# Patient Record
Sex: Female | Born: 1995 | Race: White | Hispanic: No | Marital: Single | Smoking: Former smoker
Health system: Southern US, Community
[De-identification: ages and names within clinical notes are randomized; demographics above are authoritative.]

## PROBLEM LIST (undated history)

## (undated) DIAGNOSIS — F32A Depression, unspecified: Secondary | ICD-10-CM

## (undated) DIAGNOSIS — F329 Major depressive disorder, single episode, unspecified: Secondary | ICD-10-CM

## (undated) DIAGNOSIS — R45851 Suicidal ideations: Secondary | ICD-10-CM

## (undated) DIAGNOSIS — F4323 Adjustment disorder with mixed anxiety and depressed mood: Secondary | ICD-10-CM

## (undated) DIAGNOSIS — Z789 Other specified health status: Secondary | ICD-10-CM

## (undated) DIAGNOSIS — F419 Anxiety disorder, unspecified: Secondary | ICD-10-CM

## (undated) DIAGNOSIS — F322 Major depressive disorder, single episode, severe without psychotic features: Secondary | ICD-10-CM

## (undated) DIAGNOSIS — F332 Major depressive disorder, recurrent severe without psychotic features: Secondary | ICD-10-CM

## (undated) DIAGNOSIS — R87629 Unspecified abnormal cytological findings in specimens from vagina: Secondary | ICD-10-CM

## (undated) DIAGNOSIS — F319 Bipolar disorder, unspecified: Secondary | ICD-10-CM

## (undated) DIAGNOSIS — F3131 Bipolar disorder, current episode depressed, mild: Secondary | ICD-10-CM

## (undated) DIAGNOSIS — F988 Other specified behavioral and emotional disorders with onset usually occurring in childhood and adolescence: Secondary | ICD-10-CM

## (undated) HISTORY — PX: TONSILLECTOMY: SUR1361

## (undated) HISTORY — DX: Anxiety disorder, unspecified: F41.9

## (undated) HISTORY — DX: Unspecified abnormal cytological findings in specimens from vagina: R87.629

## (undated) HISTORY — DX: Bipolar disorder, unspecified: F31.9

## (undated) HISTORY — PX: WISDOM TOOTH EXTRACTION: SHX21

---
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
## 2013-06-16 ENCOUNTER — Ambulatory Visit (HOSPITAL_COMMUNITY)
Admission: RE | Admit: 2013-06-16 | Discharge: 2013-06-16 | Disposition: A | Discharge status: Home / Self Care | Payer: 59 | Attending: Psychiatry | Admitting: Psychiatry

## 2013-06-16 ENCOUNTER — Ambulatory Visit (HOSPITAL_COMMUNITY): Admission: RE | Admit: 2013-06-16 | Payer: 59 | Source: Home / Self Care | Admitting: Psychiatry

## 2013-06-16 HISTORY — DX: Depression, unspecified: F32.A

## 2013-06-16 HISTORY — DX: Major depressive disorder, single episode, unspecified: F32.9

## 2013-06-17 ENCOUNTER — Encounter (HOSPITAL_COMMUNITY): Payer: Self-pay | Admitting: *Deleted

## 2013-06-17 NOTE — BH Assessment (Signed)
Tele Assessment Note   Debbie King is a 18 y.o. female who presents as a walk in, accompanied by a friend.  Pt denies SI/HI/AVH.  Pt requested referrals for therapy to help with depression issues and communicating with her parents.  This Clinical research associatewriter provided referrals    Axis I: Mood Disorder NOS Axis II: Deferred Axis III:  Past Medical History  Diagnosis Date  . Depression    Axis IV: other psychosocial or environmental problems, problems related to social environment and problems with primary support group Axis V: 61-70 mild symptoms  Past Medical History:  Past Medical History  Diagnosis Date  . Depression     No past surgical history on file.  Family History: No family history on file.  Social History:  reports that she does not drink alcohol or use illicit drugs. Her tobacco history is not on file.  Additional Social History:  Alcohol / Drug Use Pain Medications: None  Prescriptions: None  Over the Counter: None  History of alcohol / drug use?: No history of alcohol / drug abuse Longest period of sobriety (when/how long): None   CIWA:   COWS:    Allergies: Allergies not on file  Home Medications:  (Not in a hospital admission)  OB/GYN Status:  No LMP recorded.  General Assessment Data Location of Assessment: [facility] Services Is this a Tele or Face-to-Face Assessment?: Face-to-Face Is this an Initial Assessment or a Re-assessment for this encounter?: Initial Assessment Living Arrangements: Parent (Lives with parents ) Can pt return to current living arrangement?: Yes Admission Status: Voluntary Is patient capable of signing voluntary admission?: Yes Transfer from: Acute Hospital Referral Source: MD  Medical Screening Exam [facility] Walk-in ONLY) Medical Exam completed: No Reason for MSE not completed: Patient Refused  [facility] Living Arrangements: Parent (Lives with parents ) Name of Psychiatrist: None  Name of Therapist: None    Education Status Is patient currently in school?: No Current Grade: None  Highest grade of school patient has completed: None  Name of school: None  Contact person: None   Risk to self Suicidal Ideation: No Suicidal Intent: No Is patient at risk for suicide?: No Suicidal Plan?: No Access to Means: No What has been your use of drugs/alcohol within the last 12 months?: None  Previous Attempts/Gestures: No How many times?: 0 Other Self Harm Risks: None  Triggers for Past Attempts: None known Intentional Self Injurious Behavior: None Family Suicide History: No Recent stressful life event(s): Conflict (Comment) (Issues with parents ) Persecutory voices/beliefs?: No Depression: No Depression Symptoms:  (None reported ) Substance abuse history and/or treatment for substance abuse?: No Suicide prevention information given to non-admitted patients: Not applicable  Risk to Others Homicidal Ideation: No Thoughts of Harm to Others: No Current Homicidal Intent: No Current Homicidal Plan: No Access to Homicidal Means: No Identified Victim: None  History of harm to others?: No Assessment of Violence: None Noted Violent Behavior Description: None  Does patient have access to weapons?: No Criminal Charges Pending?: No Does patient have a court date: No  Psychosis Hallucinations: None noted Delusions: None noted  Mental Status Report Appear/Hygiene: Unremarkable Eye Contact: Good Motor Activity: Unremarkable Speech: Logical/coherent Level of Consciousness: Alert Mood: Other (Comment) (Appropriate ) Affect: Appropriate to circumstance Anxiety Level: None Thought Processes: Coherent;Relevant Judgement: Unimpaired Orientation: Person;Place;Time;Situation Obsessive Compulsive Thoughts/Behaviors: None  Cognitive Functioning Concentration: Normal Memory: Recent Intact;Remote Intact IQ: Average Insight: Good Impulse Control: Good Appetite: Good Weight Loss: 0 Weight  Gain:  0 Sleep: No Change Total Hours of Sleep: 6 Vegetative Symptoms: None  ADLScreening [facility] Assessment Services) Patient's cognitive ability adequate to safely complete daily activities?: Yes Patient able to express need for assistance with ADLs?: Yes Independently performs ADLs?: Yes (appropriate for developmental age)  Prior Inpatient Therapy Prior Inpatient Therapy: No Prior Therapy Dates: None  Prior Therapy Facilty/Provider(s): None  Reason for Treatment: None   Prior Outpatient Therapy Prior Outpatient Therapy: No Prior Therapy Dates: None  Prior Therapy Facilty/Provider(s): None  Reason for Treatment: None   ADL Screening (condition at time of admission) Patient's cognitive ability adequate to safely complete daily activities?: Yes Is the patient deaf or have difficulty hearing?: No Does the patient have difficulty seeing, even when wearing glasses/contacts?: No Does the patient have difficulty concentrating, remembering, or making decisions?: Yes Patient able to express need for assistance with ADLs?: Yes Does the patient have difficulty dressing or bathing?: No Independently performs ADLs?: Yes (appropriate for developmental age) Does the patient have difficulty walking or climbing stairs?: No Weakness of Legs: None Weakness of Arms/Hands: None  Home Assistive Devices/Equipment Home Assistive Devices/Equipment: None  Therapy Consults (therapy consults require a physician order) PT Evaluation Needed: No OT Evalulation Needed: No SLP Evaluation Needed: No Abuse/Neglect Assessment (Assessment to be complete while patient is alone) Physical Abuse: Denies Verbal Abuse: Denies Sexual Abuse: Denies Exploitation of patient/patient's resources: Denies Self-Neglect: Denies Values / Beliefs Cultural Requests During Hospitalization: None Spiritual Requests During Hospitalization: None Consults Spiritual Care Consult Needed: No Social Work Consult Needed: No Dispensing opticianAdvance  Directives (For Healthcare) Advance Directive: Patient does not have advance directive;Patient would not like information Pre-existing out of facility DNR order (yellow form or pink MOST form): No Nutrition Screen- [facility] Adult/WL/AP Patient's home diet: Regular  Additional Information 1:1 In Past 12 Months?: No CIRT Risk: No Elopement Risk: No Does patient have medical clearance?: No     Disposition:  Disposition Initial Assessment Completed for this Encounter: Yes Disposition of Patient: Other dispositions;Outpatient treatment (Referrals provided ) Type of outpatient treatment: Adult Other disposition(s): Information only  Murrell Reddeneresa C Scotty Pinder 06/17/2013 6:35 AM

## 2013-10-08 ENCOUNTER — Ambulatory Visit (HOSPITAL_COMMUNITY)
Admission: RE | Admit: 2013-10-08 | Discharge: 2013-10-08 | Disposition: A | Discharge status: Home / Self Care | Payer: PPO | Source: Home / Self Care | Attending: Psychiatry | Admitting: Psychiatry

## 2013-10-08 ENCOUNTER — Encounter (HOSPITAL_COMMUNITY): Payer: Self-pay | Admitting: *Deleted

## 2013-10-08 ENCOUNTER — Observation Stay (HOSPITAL_COMMUNITY)
Admission: AD | Admit: 2013-10-08 | Discharge: 2013-10-09 | Disposition: A | Discharge status: Home / Self Care | Payer: PPO | Attending: Psychiatry | Admitting: Psychiatry

## 2013-10-08 DIAGNOSIS — F329 Major depressive disorder, single episode, unspecified: Secondary | ICD-10-CM | POA: Diagnosis present

## 2013-10-08 DIAGNOSIS — F32A Depression, unspecified: Secondary | ICD-10-CM

## 2013-10-08 DIAGNOSIS — F4323 Adjustment disorder with mixed anxiety and depressed mood: Secondary | ICD-10-CM | POA: Diagnosis not present

## 2013-10-08 DIAGNOSIS — R45851 Suicidal ideations: Secondary | ICD-10-CM

## 2013-10-08 HISTORY — DX: Depression, unspecified: F32.A

## 2013-10-08 HISTORY — DX: Other specified health status: Z78.9

## 2013-10-08 HISTORY — DX: Suicidal ideations: R45.851

## 2013-10-08 MED ORDER — ALUM & MAG HYDROXIDE-SIMETH 200-200-20 MG/5ML PO SUSP: 30.0000 mL | ORAL | Status: DC | PRN

## 2013-10-08 MED ORDER — ACETAMINOPHEN 325 MG PO TABS: 650.0000 mg | ORAL_TABLET | Freq: Four times a day (QID) | ORAL | Status: DC | PRN

## 2013-10-08 MED ORDER — TRAZODONE HCL 50 MG PO TABS
50.0000 mg | ORAL_TABLET | Freq: Every evening | ORAL | Status: DC | PRN
  Filled 2013-10-08: qty 1

## 2013-10-08 MED ORDER — MAGNESIUM HYDROXIDE 400 MG/5ML PO SUSP: 30.0000 mL | Freq: Every day | ORAL | Status: DC | PRN

## 2013-10-08 NOTE — BH Assessment (Signed)
Assessment Note  Debbie King is an 18 y.o. female.  PT reported walking from [facility] to [facility].  She reported random crying episodes yesterday and feeling sad and depressed.  PT reported experiencing "negative thoughts" of jumping in front of a car or cutting her wrist.  PT reported current SI w/plan of jumping in front of a car.  She reported hearing "rumbling sounds ."  PT reported being unsure if she could contract for safety.  PT denied HI, AH, and VH.  PT reported being prescribed Lexapro and Citalopram and not taking the meds for the past 3 1/2 wks.  She reported engaging in weekly counseling, however she has not attended a session since 09-21-2013.  PT reported this is her first year in college and she is having some issues w/other students.  PT also reported conflict w/her Parents and reported no current support system in [place].  PT denied any drug use including ETOH.  PT reported experiencing depressive sx in April 2015 when she went off birth control.  PT reported and upcoming neurological appointment on 10-22-2013 w/[facility] for headaches.    Axis I: Anxiety Disorder NOS and Major Depression, single episode Axis II: Deferred Axis IV: educational problems, other psychosocial or environmental problems, problems related to social environment and problems with primary support group Axis V: 11-20 some danger of hurting self or others possible OR occasionally fails to maintain minimal personal hygiene OR gross impairment in communication  Past Medical History: No past medical history on file.  No past surgical history on file.  Family History: No family history on file.  Social History:  has no tobacco, alcohol, and drug history on file.  Additional Social History:     CIWA:   COWS:    Allergies: Allergies not on file  Home Medications:  (Not in a hospital admission)  OB/GYN Status:  No LMP recorded.  General Assessment Data Location of Assessment: [facility] Assessment  Services ACT Assessment: Yes Is this a Tele or Face-to-Face Assessment?: Face-to-Face Is this an Initial Assessment or a Re-assessment for this encounter?: Initial Assessment Living Arrangements: Alone;Other (Comment) (Live on [employer]) Can pt return to current living arrangement?: Yes Admission Status: Voluntary Is patient capable of signing voluntary admission?: Yes Transfer from: Home Referral Source: Self/Family/Friend  Medical Screening Exam [facility] Walk-in ONLY) Medical Exam completed: No Reason for MSE not completed: Other: (PT walk-in at [facility])  [facility] Crisis Care Plan Living Arrangements: Alone;Other (Comment) (Live on [employer]) Name of Psychiatrist: Dr. Katrinka Blazing ([facility] In [place]) Name of Therapist: Ms. Dayton Scrape ([facility] in [place])  Education Status Is patient currently in school?: Yes (first year of college) Current Grade: Freshman Highest grade of school patient has completed: High school graduate Name of school: [employer] Contact person: None  Risk to self with the past 6 months Suicidal Ideation: Yes-Currently Present Suicidal Intent: No (PT denied having intent) Is patient at risk for suicide?: Yes Suicidal Plan?: Yes-Currently Present Specify Current Suicidal Plan: Walking in front of car or cutting wrist Access to Means: No What has been your use of drugs/alcohol within the last 12 months?: PT denied any drug use Previous Attempts/Gestures: No (PT reported SI w/no plans or intent) How many times?: 0 Other Self Harm Risks: None Triggers for Past Attempts: None known Intentional Self Injurious Behavior: None Family Suicide History: Unknown Recent stressful life event(s): Other (Comment) (first yr of college, weight gain, stop birth control) Persecutory voices/beliefs?: No (PT reported having  negative thoughts and hear rumbling noise) Depression: Yes Depression Symptoms:  Tearfulness;Isolating;Feeling worthless/self pity Substance abuse history and/or treatment for substance abuse?: No (PT denied use) Suicide prevention information given to non-admitted patients: Not applicable  Risk to Others within the past 6 months Homicidal Ideation: No Thoughts of Harm to Others: No Current Homicidal Intent: No Current Homicidal Plan: No Access to Homicidal Means: No Identified Victim: N/A History of harm to others?: No Assessment of Violence: None Noted Violent Behavior Description: N/A Does patient have access to weapons?: No Criminal Charges Pending?: No Does patient have a court date: No  Psychosis Hallucinations: None noted (PT denied AH and VH) Delusions: None noted  Mental Status Report Appear/Hygiene: Unremarkable Eye Contact: Fair Motor Activity: Unremarkable Speech: Logical/coherent Level of Consciousness: Alert Mood: Anxious;Depressed Affect: Anxious;Depressed;Flat Anxiety Level: Moderate Thought Processes: Coherent Judgement: Impaired Orientation: Person;Place;Time;Situation Obsessive Compulsive Thoughts/Behaviors: None  Cognitive Functioning Concentration: Decreased Memory: Recent Intact;Remote Intact IQ: Average Insight: Poor Impulse Control: Poor Appetite: Good Weight Loss: 0 Weight Gain: 20 (since April 2015) Sleep: No Change Total Hours of Sleep: 8 Vegetative Symptoms: None  ADLScreening [facility] Assessment Services) Patient's cognitive ability adequate to safely complete daily activities?: Yes Patient able to express need for assistance with ADLs?: No Independently performs ADLs?: Yes (appropriate for developmental age)  Prior Inpatient Therapy Prior Inpatient Therapy: No Prior Therapy Dates: N/A Prior Therapy Facilty/Provider(s): N/A Reason for Treatment: N/A  Prior Outpatient Therapy Prior Outpatient Therapy: Yes Prior Therapy Dates: September 21, 2013 Prior Therapy Facilty/Provider(s): [facility]  ([place], [place]) Reason for Treatment: Anxiety  ADL Screening (condition at time of admission) Patient's cognitive ability adequate to safely complete daily activities?: Yes Is the patient deaf or have difficulty hearing?: No Does the patient have difficulty seeing, even when wearing glasses/contacts?: No Does the patient have difficulty concentrating, remembering, or making decisions?: No Patient able to express need for assistance with ADLs?: No Does the patient have difficulty dressing or bathing?: No Independently performs ADLs?: Yes (appropriate for developmental age) Does the patient have difficulty walking or climbing stairs?: No Weakness of Legs: None Weakness of Arms/Hands: None  Home Assistive Devices/Equipment Home Assistive Devices/Equipment: None    Abuse/Neglect Assessment (Assessment to be complete while patient is alone) Physical Abuse: Denies Verbal Abuse: Denies Sexual Abuse: Denies Exploitation of patient/patient's resources: Denies Self-Neglect: Denies Values / Beliefs Cultural Requests During Hospitalization: None Spiritual Requests During Hospitalization: None        Additional Information 1:1 In Past 12 Months?: No CIRT Risk: No Elopement Risk: No Does patient have medical clearance?: No (PT walk-in at [facility])     Disposition:  Disposition Initial Assessment Completed for this Encounter: Yes Disposition of Patient: Inpatient treatment program (OBS) Type of inpatient treatment program: Adult (OBS)  On Site Evaluation by:   Reviewed with Physician:    Dey-Johnson,Mesa Janus 10/08/2013 7:06 PM

## 2013-10-08 NOTE — BH Assessment (Signed)
Consulted with Renata Caprice, NP after completing assessment on PT.  Per Renata Caprice; PT be placed in  OBS.  Consulted with Tanna Savoy and Per Minerva Areola PT be placed in OBS 6.  Per Clearence Cheek, Tech will follow up with PT on disposition.

## 2013-10-09 DIAGNOSIS — F329 Major depressive disorder, single episode, unspecified: Secondary | ICD-10-CM | POA: Diagnosis not present

## 2013-10-09 MED ORDER — CITALOPRAM HYDROBROMIDE 10 MG PO TABS
10.0000 mg | ORAL_TABLET | Freq: Every day | ORAL | Status: DC
  Filled 2013-10-09 (×4): qty 1

## 2013-10-09 MED ORDER — HYDROXYZINE HCL 25 MG PO TABS: 25.0000 mg | ORAL_TABLET | Freq: Four times a day (QID) | ORAL | Status: DC | PRN

## 2013-10-09 MED ORDER — CITALOPRAM HYDROBROMIDE 10 MG PO TABS: 10.0000 mg | ORAL_TABLET | Freq: Every day | ORAL | Status: DC

## 2013-10-09 MED ORDER — HYDROXYZINE HCL 25 MG PO TABS
25.0000 mg | ORAL_TABLET | Freq: Four times a day (QID) | ORAL | Status: DC | PRN
  Filled 2013-10-09: qty 1

## 2013-10-09 NOTE — Progress Notes (Signed)
Pt denies SI/HI/AVH. Pt given discharge instructions including prescriptions and f/u appointments. Pt states understanding. Pt states receipt of all belongings.   

## 2013-10-09 NOTE — Plan of Care (Signed)
[facility] Observation Crisis Plan  Reason for Crisis Plan:  Crisis Stabilization   Plan of Care:  Referral for Telepsychiatry/Psychiatric Consult  Family Support:    mother Debbie King  Current Living Environment:  Living Arrangements: Alone;Spouse/significant other  Insurance:   Hospital Account   Name Acct ID Class Status Primary Coverage   Debbie King, Debbie King [id number] BEHAVIORAL HEALTH OBSERVATION Open None        Guarantor Account (for Hospital Account [id number])   Name Relation to Pt Service Area Active? Acct Type   Debbie King Self [employer] [facility]   Address Phone       [address] [place], [place] [address] [phone](H)          Coverage Information (for Hospital Account [id number])   Not on file      Legal Guardian:    SELF  Primary Care Provider:  Dr. Katrinka Blazing  Current Outpatient Providers:  NONE  Psychiatrist:   NONE  Counselor/Therapist:   NONE  Compliant with Medications:  No  Additional Information:   Debbie King 9/6/20156:51 AM

## 2013-10-09 NOTE — Discharge Summary (Signed)
[facility] OBS UNIT DISCHARGE SUMMARY & SRA  Subjective: Pt seen and chart reviewed. Pt reports hearing voices that are telling her to harm self. Pt reports that they are somewhat better, but that her thoughts are racing a lot. Pt also reports that she had a break from school when this happened last semester and that she does not want to miss more school. Pt is open to outpatient and will be sent home with resources. This NP contacted pt's father Tziporah Knoke [phone]) and he is available as a strong local support system if pt needs this; pt is aware.   HPI: Debbie King is an 18 y.o. female. PT reported walking from [facility] to [facility].  She reported random crying episodes yesterday and feeling sad and depressed.  PT reported experiencing "negative thoughts" of jumping in front of a car or cutting her wrist.  PT reported current SI w/plan of jumping in front of a car.  She reported hearing "rumbling sounds ."  PT reported being unsure if she could contract for safety.  PT denied HI, AH, and VH.  PT reported being prescribed Lexapro and Citalopram and not taking the meds for the past 3 1/2 wks.  She reported engaging in weekly counseling, however she has not attended a session since 09-21-2013.  PT reported this is her first year in college and she is having some issues w/other students.  PT also reported conflict w/her Parents and reported no current support system in [place].  PT denied any drug use including ETOH.  PT reported experiencing depressive sx in April 2015 when she went off birth control.  PT reported and upcoming neurological appointment on 10-22-2013 w/[facility] for headaches.    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 Current non-smoker but past smoking history unknown    Axis IV: other psychosocial or environmental problems and problems related to social  environment Axis V: 41-50 serious symptoms  Psychiatric Specialty Exam: Physical Exam  Review of Systems  Constitutional: Negative.   HENT: Negative.   Eyes: Negative.   Respiratory: Negative.   Cardiovascular: Negative.   Gastrointestinal: Negative.   Genitourinary: Negative.   Musculoskeletal: Negative.   Skin: Negative.   Neurological: Negative.   Endo/Heme/Allergies: Negative.     Blood pressure 99/59, pulse 77, temperature 97.9 F (36.6 C), temperature source Oral, resp. rate 16, height  (1.626 m), weight 65.772 kg (145 lb), SpO2 100.00%.Body mass index is 24.88 kg/(m^2).  General Appearance: Casual  Eye Contact::  Fair  Speech:  Clear and Coherent  Volume:  Normal  Mood:  Anxious and Depressed  Affect:  Appropriate and Congruent  Thought Process:  Coherent and Goal Directed  Orientation:  Full (Time, Place, and Person)  Thought Content:  WDL  Suicidal Thoughts:  No  Not since yesterday  Homicidal Thoughts:  No  Memory:  Immediate;   Good Recent;   Good Remote;   Good  Judgement:  Good  Insight:  Good  Psychomotor Activity:  Normal  Concentration:  Good  Recall:  Good  Akathisia:  No  Handed:  Right  AIMS (if indicated):     Assets:  Desire for Improvement Resilience  Sleep:       Medications: acetaminophen, alum & mag hydroxide-simeth, magnesium hydroxide, traZODone   History reviewed. No pertinent past surgical history.  Family History: No family history on file.   Social  History:  reports that she has never smoked. She does not have any smokeless tobacco history on file. Her alcohol and drug histories are not on file.       Suicide Risk Assessment     Nursing information obtained from:    Demographic factors:    Current Mental Status:    Loss Factors:    Historical Factors:    Risk Reduction Factors:    Total Time spent with patient: 45 minutes  CLINICAL FACTORS:   Depression:   Hopelessness  Psychiatric Specialty Exam:      Blood pressure 99/59, pulse 77, temperature 97.9 F (36.6 C), temperature source Oral, resp. rate 16, height  (1.626 m), weight 65.772 kg (145 lb), SpO2 100.00%.Body mass index is 24.88 kg/(m^2).  SEE PSE ABOVE  COGNITIVE FEATURES THAT CONTRIBUTE TO RISK:  Closed-mindedness    SUICIDE RISK:   Mild:  Suicidal ideation of limited frequency, intensity, duration, and specificity.  There are no identifiable plans, no associated intent, mild dysphoria and related symptoms, good self-control (both objective and subjective assessment), few other risk factors, and identifiable protective factors, including available and accessible social support.  Plan:   Discharge home with outpatient resources and prescription for Vistaril and Celexa.  -Celexa  daily for depression -Vistaril  q6h PRN for anxiety and racing thoughts   Beau Fanny, FNP-BC 10/09/2013, 6:59 PM

## 2013-10-09 NOTE — H&P (Signed)
[facility] OBS UNIT H&P  Subjective: Pt seen and chart reviewed. Pt reports hearing voices that are telling her to harm self. Pt reports that they are somewhat better, but that her thoughts are racing a lot. Pt also reports that she had a break from school when this happened last semester and that she does not want to miss more school. Pt is open to outpatient and will be sent home with resources. This NP contacted pt's father Naomii Kreger [phone]) and he is available as a strong local support system if pt needs this; pt is aware.   HPI: Debbie King is an 18 y.o. female. PT reported walking from [facility] to [facility].  She reported random crying episodes yesterday and feeling sad and depressed.  PT reported experiencing "negative thoughts" of jumping in front of a car or cutting her wrist.  PT reported current SI w/plan of jumping in front of a car.  She reported hearing "rumbling sounds ."  PT reported being unsure if she could contract for safety.  PT denied HI, AH, and VH.  PT reported being prescribed Lexapro and Citalopram and not taking the meds for the past 3 1/2 wks.  She reported engaging in weekly counseling, however she has not attended a session since 09-21-2013.  PT reported this is her first year in college and she is having some issues w/other students.  PT also reported conflict w/her Parents and reported no current support system in [place].  PT denied any drug use including ETOH.  PT reported experiencing depressive sx in April 2015 when she went off birth control.  PT reported and upcoming neurological appointment on 10-22-2013 w/[facility] for headaches.    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 Current non-smoker but past smoking history unknown    Axis IV: other psychosocial or environmental problems and problems related to social environment Axis V: 41-50 serious  symptoms  Psychiatric Specialty Exam: Physical Exam  Review of Systems  Constitutional: Negative.   HENT: Negative.   Eyes: Negative.   Respiratory: Negative.   Cardiovascular: Negative.   Gastrointestinal: Negative.   Genitourinary: Negative.   Musculoskeletal: Negative.   Skin: Negative.   Neurological: Negative.   Endo/Heme/Allergies: Negative.     Blood pressure 99/59, pulse 77, temperature 97.9 F (36.6 C), temperature source Oral, resp. rate 16, height  (1.626 m), weight 65.772 kg (145 lb), SpO2 100.00%.Body mass index is 24.88 kg/(m^2).  General Appearance: Casual  Eye Contact::  Fair  Speech:  Clear and Coherent  Volume:  Normal  Mood:  Anxious and Depressed  Affect:  Appropriate and Congruent  Thought Process:  Coherent and Goal Directed  Orientation:  Full (Time, Place, and Person)  Thought Content:  WDL  Suicidal Thoughts:  No  Not since yesterday  Homicidal Thoughts:  No  Memory:  Immediate;   Good Recent;   Good Remote;   Good  Judgement:  Good  Insight:  Good  Psychomotor Activity:  Normal  Concentration:  Good  Recall:  Good  Akathisia:  No  Handed:  Right  AIMS (if indicated):     Assets:  Desire for Improvement Resilience  Sleep:       Medications: acetaminophen, alum & mag hydroxide-simeth, magnesium hydroxide, traZODone   History reviewed. No pertinent past surgical history.  Family History: No family history on file.  Social History:  reports that  she has never smoked. She does not have any smokeless tobacco history on file. Her alcohol and drug histories are not on file.     Plan:   Discharge home with outpatient resources and prescription for Vistaril and Celexa.   Beau Fanny, FNP-BC 10/09/2013 11:31 AM  Patient seen, evaluated and I agree with notes by Nurse Practitioner. Thedore Mins, MD

## 2013-10-09 NOTE — Progress Notes (Signed)
D:  Patient has been up and about in the unit.  She showered today and has been interacting with peers some.  Affect is flat, but brightens easily.  Appetite has been good.   A:  Spoke with patient one on one.  Offered daily workbook.  Offered support and encouragement.   R:  Cooperative with staff.  Interacts well with peers.  Remains depressed, but improved.

## 2013-10-09 NOTE — Progress Notes (Signed)
Patient walked in from [employer] to a [facility] and was admitted to OBS. Unit with a complaint of SI with a plan to jump into a moving vehicle and die. Patient reports self cutting 2 months ago and has been off her depression and anxiety medications 2.5 to 3 weeks ago. She stated that her reason for coming here was as result of crying spells last night and didn't just want to be alive. Patient denies VH/AH but states that "I hear my brain talk and I see my self repeating what my brain says". She report having this headache that follows after her brain stops talking. Every 15 minutes check was initiated for safety. Offered support and encouragement. Patient encouraged to be compliant with her medications and treatment plans. Patient tossing and turning, not able to sleep. Offered and initially accepted PO PRN of Trazodone 50 mg for insomnia but later refused the medication after it has been scanned and opened. No reason was given for the refusal. Will continue to monitor patient.

## 2013-10-09 NOTE — Progress Notes (Signed)
[facility] INPATIENT:  Family/Significant Other Suicide Prevention Education  Suicide Prevention Education:  Patient Refusal for Family/Significant Other Suicide Prevention Education: The patient Debbie King has refused to provide written consent for family/significant other to be provided Family/Significant Other Suicide Prevention Education during admission and/or prior to discharge.  Physician notified.  Glenice Laine B 10/09/2013, 6:57 AM

## 2013-10-13 ENCOUNTER — Ambulatory Visit (HOSPITAL_COMMUNITY)
Admission: RE | Admit: 2013-10-13 | Discharge: 2013-10-13 | Disposition: A | Discharge status: Home / Self Care | Payer: Self-pay | Source: Home / Self Care | Attending: Psychiatry | Admitting: Psychiatry

## 2013-10-14 ENCOUNTER — Encounter (HOSPITAL_COMMUNITY): Payer: Self-pay | Admitting: Emergency Medicine

## 2013-10-14 ENCOUNTER — Encounter (HOSPITAL_COMMUNITY): Payer: Self-pay | Admitting: *Deleted

## 2013-10-14 ENCOUNTER — Inpatient Hospital Stay (HOSPITAL_COMMUNITY)
Admission: AD | Admit: 2013-10-14 | Discharge: 2013-10-26 | DRG: 885 | Disposition: A | Discharge status: Home / Self Care | Payer: 59 | Source: Intra-hospital | Attending: Psychiatry | Admitting: Psychiatry

## 2013-10-14 ENCOUNTER — Emergency Department (HOSPITAL_COMMUNITY)
Admission: EM | Admit: 2013-10-14 | Discharge: 2013-10-14 | Disposition: A | Discharge status: Other Acute Inpatient Hospital | Payer: 59 | Attending: Emergency Medicine | Admitting: Emergency Medicine

## 2013-10-14 DIAGNOSIS — R5381 Other malaise: Secondary | ICD-10-CM | POA: Diagnosis present

## 2013-10-14 DIAGNOSIS — F17200 Nicotine dependence, unspecified, uncomplicated: Secondary | ICD-10-CM | POA: Diagnosis not present

## 2013-10-14 DIAGNOSIS — F332 Major depressive disorder, recurrent severe without psychotic features: Principal | ICD-10-CM | POA: Diagnosis present

## 2013-10-14 DIAGNOSIS — F251 Schizoaffective disorder, depressive type: Secondary | ICD-10-CM

## 2013-10-14 DIAGNOSIS — F322 Major depressive disorder, single episode, severe without psychotic features: Secondary | ICD-10-CM | POA: Diagnosis present

## 2013-10-14 DIAGNOSIS — G4710 Hypersomnia, unspecified: Secondary | ICD-10-CM | POA: Diagnosis present

## 2013-10-14 DIAGNOSIS — F411 Generalized anxiety disorder: Secondary | ICD-10-CM | POA: Diagnosis present

## 2013-10-14 DIAGNOSIS — F259 Schizoaffective disorder, unspecified: Secondary | ICD-10-CM | POA: Diagnosis present

## 2013-10-14 DIAGNOSIS — F329 Major depressive disorder, single episode, unspecified: Secondary | ICD-10-CM | POA: Diagnosis present

## 2013-10-14 DIAGNOSIS — R45851 Suicidal ideations: Secondary | ICD-10-CM | POA: Insufficient documentation

## 2013-10-14 DIAGNOSIS — F32A Depression, unspecified: Secondary | ICD-10-CM

## 2013-10-14 DIAGNOSIS — Z3202 Encounter for pregnancy test, result negative: Secondary | ICD-10-CM | POA: Diagnosis not present

## 2013-10-14 DIAGNOSIS — Z91199 Patient's noncompliance with other medical treatment and regimen due to unspecified reason: Secondary | ICD-10-CM | POA: Diagnosis not present

## 2013-10-14 DIAGNOSIS — G4700 Insomnia, unspecified: Secondary | ICD-10-CM | POA: Diagnosis present

## 2013-10-14 DIAGNOSIS — F3289 Other specified depressive episodes: Secondary | ICD-10-CM

## 2013-10-14 DIAGNOSIS — R5383 Other fatigue: Secondary | ICD-10-CM

## 2013-10-14 DIAGNOSIS — F29 Unspecified psychosis not due to a substance or known physiological condition: Secondary | ICD-10-CM

## 2013-10-14 DIAGNOSIS — Z9119 Patient's noncompliance with other medical treatment and regimen: Secondary | ICD-10-CM | POA: Diagnosis not present

## 2013-10-14 DIAGNOSIS — F919 Conduct disorder, unspecified: Secondary | ICD-10-CM | POA: Insufficient documentation

## 2013-10-14 HISTORY — DX: Suicidal ideations: R45.851

## 2013-10-14 HISTORY — DX: Major depressive disorder, single episode, severe without psychotic features: F32.2

## 2013-10-14 HISTORY — DX: Major depressive disorder, recurrent severe without psychotic features: F33.2

## 2013-10-14 LAB — CBC WITH DIFFERENTIAL/PLATELET
BASOS ABS: 0 10*3/uL (ref 0.0–0.1)
BASOS PCT: 0 % (ref 0–1)
EOS ABS: 0.1 10*3/uL (ref 0.0–0.7)
Eosinophils Relative: 2 % (ref 0–5)
HCT: 38 % (ref 36.0–46.0)
Hemoglobin: 13.3 g/dL (ref 12.0–15.0)
Lymphocytes Relative: 43 % (ref 12–46)
Lymphs Abs: 2.6 10*3/uL (ref 0.7–4.0)
MCH: 30.5 pg (ref 26.0–34.0)
MCHC: 35 g/dL (ref 30.0–36.0)
MCV: 87.2 fL (ref 78.0–100.0)
MONO ABS: 0.5 10*3/uL (ref 0.1–1.0)
Monocytes Relative: 7 % (ref 3–12)
Neutro Abs: 2.9 10*3/uL (ref 1.7–7.7)
Neutrophils Relative %: 48 % (ref 43–77)
Platelets: 263 10*3/uL (ref 150–400)
RBC: 4.36 MIL/uL (ref 3.87–5.11)
RDW: 12.1 % (ref 11.5–15.5)
WBC: 6.1 10*3/uL (ref 4.0–10.5)

## 2013-10-14 LAB — COMPREHENSIVE METABOLIC PANEL
ALBUMIN: 4.4 g/dL (ref 3.5–5.2)
ALK PHOS: 62 U/L (ref 39–117)
ALT: 16 U/L (ref 0–35)
ANION GAP: 14 (ref 5–15)
AST: 20 U/L (ref 0–37)
BILIRUBIN TOTAL: 0.4 mg/dL (ref 0.3–1.2)
BUN: 12 mg/dL (ref 6–23)
CO2: 23 mEq/L (ref 19–32)
CREATININE: 0.77 mg/dL (ref 0.50–1.10)
Calcium: 9.4 mg/dL (ref 8.4–10.5)
Chloride: 104 mEq/L (ref 96–112)
GFR calc non Af Amer: >90 mL/min (ref >90)
GLUCOSE: 91 mg/dL (ref 70–99)
POTASSIUM: 3.9 meq/L (ref 3.7–5.3)
SODIUM: 141 meq/L (ref 137–147)
Total Protein: 7.1 g/dL (ref 6.0–8.3)

## 2013-10-14 LAB — RAPID URINE DRUG SCREEN, HOSP PERFORMED
Amphetamines: NOT DETECTED
BARBITURATES: NOT DETECTED
BENZODIAZEPINES: NOT DETECTED
COCAINE: NOT DETECTED
OPIATES: NOT DETECTED
Tetrahydrocannabinol: NOT DETECTED

## 2013-10-14 LAB — SALICYLATE LEVEL: Salicylate Lvl: <2 mg/dL — ABNORMAL LOW (ref 2.8–20.0)

## 2013-10-14 LAB — ACETAMINOPHEN LEVEL: Acetaminophen (Tylenol), Serum: <15 ug/mL (ref 10–30)

## 2013-10-14 LAB — PREGNANCY, URINE: Preg Test, Ur: NEGATIVE

## 2013-10-14 LAB — ETHANOL

## 2013-10-14 MED ORDER — NICOTINE 21 MG/24HR TD PT24: 21.0000 mg | MEDICATED_PATCH | Freq: Every day | TRANSDERMAL | Status: DC

## 2013-10-14 MED ORDER — ACETAMINOPHEN 325 MG PO TABS
650.0000 mg | ORAL_TABLET | ORAL | Status: DC | PRN
Start: 2013-10-14 — End: 2013-10-14

## 2013-10-14 MED ORDER — IBUPROFEN 200 MG PO TABS: 600.0000 mg | ORAL_TABLET | Freq: Three times a day (TID) | ORAL | Status: DC | PRN

## 2013-10-14 MED ORDER — ZOLPIDEM TARTRATE 5 MG PO TABS: 5.0000 mg | ORAL_TABLET | Freq: Every evening | ORAL | Status: DC | PRN

## 2013-10-14 MED ORDER — MAGNESIUM HYDROXIDE 400 MG/5ML PO SUSP: 30.0000 mL | Freq: Every day | ORAL | Status: DC | PRN

## 2013-10-14 MED ORDER — NICOTINE 21 MG/24HR TD PT24
21.0000 mg | MEDICATED_PATCH | Freq: Every day | TRANSDERMAL | Status: DC
  Filled 2013-10-14 (×6): qty 1

## 2013-10-14 MED ORDER — ALUM & MAG HYDROXIDE-SIMETH 200-200-20 MG/5ML PO SUSP: 30.0000 mL | ORAL | Status: DC | PRN

## 2013-10-14 MED ORDER — FLUOXETINE HCL 20 MG PO CAPS
20.0000 mg | ORAL_CAPSULE | Freq: Every day | ORAL | Status: DC
  Filled 2013-10-14: qty 1

## 2013-10-14 MED ORDER — ACETAMINOPHEN 325 MG PO TABS
650.0000 mg | ORAL_TABLET | Freq: Four times a day (QID) | ORAL | Status: DC | PRN
Start: 2013-10-14 — End: 2013-10-26
  Filled 2013-10-14: qty 2

## 2013-10-14 MED ORDER — ONDANSETRON HCL 4 MG PO TABS: 4.0000 mg | ORAL_TABLET | Freq: Three times a day (TID) | ORAL | Status: DC | PRN

## 2013-10-14 MED ORDER — LORAZEPAM 1 MG PO TABS: 1.0000 mg | ORAL_TABLET | Freq: Three times a day (TID) | ORAL | Status: DC | PRN

## 2013-10-14 MED ORDER — FLUOXETINE HCL 20 MG PO CAPS
20.0000 mg | ORAL_CAPSULE | Freq: Every day | ORAL | Status: DC
  Administered 2013-10-16 – 2013-10-17 (×2): 20 mg via ORAL
  Filled 2013-10-14 (×6): qty 1

## 2013-10-14 NOTE — Progress Notes (Signed)
18 year old female pt admitted on voluntary basis. Pt reports that she has been feeling depressed and suicidal and spoke about how she was wanting to kill herself last night. Pt is able to contract for safety on the unit. Pt spoke about how she has not been taking her medications and isn't sure if she wants to take any medications or not and spoke about how she did not take medications that were offered to her earlier. Pt reports that she lives at [facility] on campus and spoke about feeling alone and having very little support system. Pt was oriented to the unit and safety maintained.

## 2013-10-14 NOTE — ED Provider Notes (Signed)
Medical screening examination/treatment/procedure(s) were performed by non-physician practitioner and as supervising physician I was immediately available for consultation/collaboration.   EKG Interpretation None       Wataru Mccowen M Kimberlin Scheel, MD 10/14/13 0739 

## 2013-10-14 NOTE — Progress Notes (Signed)
[facility] [employer],   Provided pt with information on [employer] of the [place] for substance abuse and mental health counseling.

## 2013-10-14 NOTE — ED Provider Notes (Signed)
CSN: [id number]     Arrival date & time 10/14/13  0108 History   First MD Initiated Contact with Patient 10/14/13 0117     Chief Complaint  Patient presents with  . Suicidal    (Consider location/radiation/quality/duration/timing/severity/associated sxs/prior Treatment) HPI Comments: 18 year old female presents to the emergency department for worsening depression x6 months. Patient states that she has been having intermittent suicidal ideations. Patient with plan to run out in front of traffic. She states that she has been diagnosed with anxiety and depression. She was put on medications for this, but has been noncompliant because she states they make her "feel bad". Patient states that she is always tired and only sleeping. She thinks that her symptoms are secondary to Mirena; however, she has been off of this for 6 months with no improvement in symptoms. Patient denies homicidal ideations, alcohol use, and illicit drug use.  The history is provided by the patient. No language interpreter was used.    Past Medical History  Diagnosis Date  . Depression    History reviewed. No pertinent past surgical history. History reviewed. No pertinent family history. History  Substance Use Topics  . Smoking status: Current Every Day Smoker  . Smokeless tobacco: Not on file  . Alcohol Use: No   OB History   Grav Para Term Preterm Abortions TAB SAB Ect Mult Living                  Review of Systems  Psychiatric/Behavioral: Positive for suicidal ideas and behavioral problems.  All other systems reviewed and are negative.   Allergies  Citalopram  Home Medications   Prior to Admission medications   Not on File   BP 97/68  Pulse 68  Temp(Src) 98.5 F (36.9 C) (Oral)  Resp 18  SpO2 100%  LMP 10/12/2013  Physical Exam  Nursing note and vitals reviewed. Constitutional: She is oriented to person, place, and time. She appears well-developed and well-nourished. No distress.  HENT:   Head: Normocephalic and atraumatic.  Eyes: Conjunctivae and EOM are normal. No scleral icterus.  Neck: Normal range of motion.  Cardiovascular: Normal rate, regular rhythm and normal heart sounds.   Pulmonary/Chest: Effort normal and breath sounds normal. No respiratory distress. She has no wheezes. She has no rales.  Musculoskeletal: Normal range of motion.  Neurological: She is alert and oriented to person, place, and time.  Skin: Skin is warm and dry. No rash noted. She is not diaphoretic. No erythema. No pallor.  Psychiatric: Her speech is normal. She is withdrawn. Cognition and memory are normal. She exhibits a depressed mood. She expresses suicidal ideation. She expresses no homicidal ideation. She expresses suicidal plans. She expresses no homicidal plans.    ED Course  Procedures (including critical care time)  Labs Review Labs Reviewed  SALICYLATE LEVEL - Abnormal; Notable for the following:    Salicylate Lvl <2.0 (*)    All other components within normal limits  CBC WITH DIFFERENTIAL  COMPREHENSIVE METABOLIC PANEL  URINE RAPID DRUG SCREEN (HOSP PERFORMED)  PREGNANCY, URINE  ETHANOL  ACETAMINOPHEN LEVEL    Imaging Review No results found.   EKG Interpretation None      MDM   Final diagnoses:  Depression with suicidal ideation    Patient presents for worsening depression and suicidal ideation. Symptoms worsening x6 months. Patient denies HI, alcohol use, and illicit drug use. Labs reviewed and patient medically cleared. Patient has been evaluated by TTS. Patient needs inpatient criteria. No beds available  at present. Psych hold orders placed and placement pending. Disposition to be determined by oncoming ED provider.   Filed Vitals:   10/14/13 0112 10/14/13 0537  BP: 110/55 97/68  Pulse: 88 68  Temp: 98.7 F (37.1 C) 98.5 F (36.9 C)  TempSrc: Oral Oral  Resp: 20 18  SpO2: 100% 100%     Antony Madura, PA-C 10/14/13 [phone]

## 2013-10-14 NOTE — Tx Team (Signed)
Initial Interdisciplinary Treatment Plan   PATIENT STRESSORS: Medication change or noncompliance   PROBLEM LIST: Problem List/Patient Goals Date to be addressed Date deferred Reason deferred Estimated date of resolution  voices 10/14/13     "I've been wanting to kill myself" 10/14/13                                                DISCHARGE CRITERIA:  Ability to meet basic life and health needs Improved stabilization in mood, thinking, and/or behavior Verbal commitment to aftercare and medication compliance  PRELIMINARY DISCHARGE PLAN: Attend aftercare/continuing care group  PATIENT/FAMIILY INVOLVEMENT: This treatment plan has been presented to and reviewed with the patient, GALIT URICH, and/or family member, .  The patient and family have been given the opportunity to ask questions and make suggestions.  Tayley Mudrick, [place] 10/14/2013, 6:16 PM

## 2013-10-14 NOTE — ED Notes (Signed)
Pt presents with SI, plan to walk in front of traffic.  Pt found by stranger wandering down street, pt reports she was attempting to get to hospital. Denies HI or feeling hopeless.  Denies  recent drug or alcohol use.  Pt reports anxiety increased after stopping Mirena birth control.  Pt recently in OBS unit 10/08/13.  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  Pt cooperative but anxious.
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
## 2013-10-14 NOTE — ED Notes (Signed)
Pt states that she's never been suicidal before, she's hearing voices that are telling her to kill herself, she states that she doesn't feel well in general and wants to know what's wrong with her.

## 2013-10-14 NOTE — Progress Notes (Signed)
Writer spoke with patient 1:1 and she reports that she came d/t feeling like she wanted to walk into traffic. Patient reports that she has not felt right since her Nat Math (IUD) was removed. She reports that she has felt more depressed and suicidal and is not sure where all this is coming from. She was informed of medications available and scheduled and she reports that she does not want to take any medications right now at least until she sees a doctor. Patient reports that she has family members but does not care to talk to them about what she is experiencing right now. Patient currently denies si/hi/a/v hallucinations.Patient currently does not have a goal that she is working towards. Support and encouragement given. Patient was given toiletries and fluids as she requested. Safety maintained on unit with 15 min checks.

## 2013-10-15 DIAGNOSIS — R45851 Suicidal ideations: Secondary | ICD-10-CM

## 2013-10-15 DIAGNOSIS — F323 Major depressive disorder, single episode, severe with psychotic features: Secondary | ICD-10-CM

## 2013-10-15 MED ORDER — OLANZAPINE 10 MG PO TBDP
5.0000 mg | ORAL_TABLET | Freq: Three times a day (TID) | ORAL | Status: DC | PRN
  Administered 2013-10-22: 5 mg via ORAL
  Filled 2013-10-15 (×2): qty 1

## 2013-10-15 MED ORDER — OLANZAPINE 10 MG IM SOLR: 5.0000 mg | Freq: Three times a day (TID) | INTRAMUSCULAR | Status: DC | PRN

## 2013-10-15 NOTE — Progress Notes (Addendum)
Debbie King is seen out in the milieu...she pretty much keeps to herself, not initiating conversation with anybody, but rather sitting IN the group and just watching and listening to others' responses.   A She refuses her meds this afternoon ( she would not awaken this morning to take them).  Late this afternoon ( about 1600 ) she spoke with Child psychotherapist, whom she told that she is feeling uncomfortable with overtures from female pt on the hall she's on...that this is troubling her and that she " can't handle it" . She hands in her morning assessment and on it she rated her depression, hopelessness and anxiety " 7/10/7" , respectively and she wrote she " has experienced " SI within the past 24 hrs...when asked by this nurse if she could and would contract for safety ...she hesitated but then said " yes". She demonstrates thought-blocking, she is visibly responding to internal stimuli...she is observed stopping as she speaks , pausing as if she is listening to something...and then continuing to speak. She shares with this nurse that she " will agree" to new medicine, " if you think it'll help me..."    R Safety is in place and poc contd.

## 2013-10-15 NOTE — [facility] Suicide Risk Assessment (Signed)
Suicide Risk Assessment  Admission Assessment     Nursing information obtained from:    Demographic factors:    Current Mental Status:    Loss Factors:    Historical Factors:    Risk Reduction Factors:    Total Time spent with patient: 1 hour  CLINICAL FACTORS:   Dysthymia Alcohol/Substance Abuse/Dependencies More than one psychiatric diagnosis Unstable or Poor Therapeutic Relationship Previous Psychiatric Diagnoses and Treatments  Psychiatric Specialty Exam:     Blood pressure 106/60, pulse 110, temperature 98.2 F (36.8 C), temperature source Oral, resp. rate 18, height  (1.6 m), weight 142 lb (64.411 kg), last menstrual period 10/12/2013.Body mass index is 25.16 kg/(m^2).  General Appearance: Casual  Eye Contact::  Fair  Speech:  Slow  Volume:  Decreased  Mood:  Dysphoric  Affect:  Congruent  Thought Process:  Coherent  Orientation:  Full (Time, Place, and Person)  Thought Content:  Rumination  Suicidal Thoughts:  Yes.  without intent/plan. Denies to carry out plan and feels better since yesterday.  Homicidal Thoughts:  No  Memory:  Recent;   Poor  Judgement:  Poor  Insight:  Shallow  Psychomotor Activity:  Decreased  Concentration:  Fair  Recall:  [employer] of Knowledge:Fair  Language: Fair  Akathisia:  Negative  Handed:  Right  AIMS (if indicated):     Assets:  Desire for Improvement Financial Resources/Insurance Social Support  Sleep:  Number of Hours: 4.75   Musculoskeletal: Strength & Muscle Tone: within normal limits Gait & Station: normal Patient leans: N/A  COGNITIVE FEATURES THAT CONTRIBUTE TO RISK:  Closed-mindedness Polarized thinking    SUICIDE RISK:   Moderate:  Frequent suicidal ideation with limited intensity, and duration, some specificity in terms of plans, no associated intent, good self-control, limited dysphoria/symptomatology, some risk factors present, and identifiable protective factors, including available and accessible  social support.  PLAN OF CARE:  I certify that inpatient services furnished can reasonably be expected to improve the patient's condition.  Debbie King 10/15/2013, 9:56 AM
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
## 2013-10-15 NOTE — Clinical Social Work Note (Signed)
Clinical Social Work Note  Patient reports the following symptoms over the last 8 months that she would like doctor to be made aware of:  1.  No energy (lethargic). 2.  Slow reaction time/slow thinking. 3.  Feel tired all the time. 4.  Sleep a lot. 5.  Feel like I'm dying. 6.  Headaches. 7.  Always cold. 9.  Muscle twitches. 10.  Never happy. 11.  Chest pain.  "These things have been going on for over 8 months and my blood work has come back with nothing wrong.  What I feel like is my body is shutting down.  Please help!"  [name], LCSW 10/15/2013, 4:17 PM

## 2013-10-15 NOTE — Progress Notes (Signed)
Writer entered patients room and observed her lying in bed resting. Patient was easily aroused when her name was called. Writer asked if she felt ok and she reports that she is fine and does not need anything. She reports to Clinical research associate when asked if she felt suicidal that after she talked with the social worker today and she talked about her past it caused her to feel suicidal and she verbally contracts currently. She denies hi/a/v hallucinations. Writer informed patient of medications available and she declined any medications. Encouraged her to get rest, safety maintained on unit with 15 min checks.

## 2013-10-15 NOTE — Progress Notes (Signed)
Did not attend group 

## 2013-10-15 NOTE — Progress Notes (Signed)
.  Psychoeducational Group Note    Date: 10/15/2013 Time:  0930   Goal Setting Purpose of Group: To be able to set a goal that is measurable and that can be accomplished in one day Participation Level:  Did not attend  Amand Lemoine A  

## 2013-10-15 NOTE — Plan of Care (Signed)
Problem: Ineffective individual coping Goal: STG: Patient will remain free from self harm Outcome: Progressing Patient has remained free from self harm  Problem: Diagnosis: Increased Risk For Suicide Attempt Goal: STG-Patient Will Attend All Groups On The Unit Outcome: Progressing Patient attended group this evening
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
## 2013-10-15 NOTE — Clinical Social Work Note (Signed)
Clinical Social Work Note  At patient's request and with a signed consent to release information to [employer], CSW called that office at [phone] and was put into a confidential voice mail.  CSW left a message at patient's request that patient is not missing, but has been hospitalized at [facility].  Further stated that she wanted to discuss with the Counseling Office her options for taking a medical leave of absence.  [name], LCSW 10/15/2013, 5:00 PM

## 2013-10-15 NOTE — [facility] Group Notes (Signed)
[facility] Group Notes:  (Clinical Social Work)  10/15/2013     10-11AM  Summary of Progress/Problems:   The main focus of today's process group was for the patient to identify a problematic behavior that contributed to the reason they needed to be in the hospital, then to consider where they are in the Stages of Change with regard to that behavior.   Motivational Interviewing and a worksheet were utilized to help patients explore in depth the perceived benefits and costs of their self-sabotage behaviors.    The patient expressed that she has anxiety and depression along with suicidal ideation, and that she was taken pills to deal with this prior to hospitalization.  Now that she is in the hospital, she feels that pills are being "thrown at" the problem, and that this is not going to work for her.  She feels she is in the Contemplation Stage of Change.  Type of Therapy:  Group Therapy - Process   Participation Level:  Active  Participation Quality:  Attentive and Sharing  Affect:  Anxious and Blunted  Cognitive:  Alert and Appropriate  Insight:  Developing/Improving  Engagement in Therapy:  Developing/Improving  Modes of Intervention:  Education, Processing, Motivational Interviewing  [name], LCSW 10/15/2013, 12:21 PM

## 2013-10-16 DIAGNOSIS — F332 Major depressive disorder, recurrent severe without psychotic features: Principal | ICD-10-CM

## 2013-10-16 LAB — TSH: TSH: 1.63 u[IU]/mL (ref 0.350–4.500)

## 2013-10-16 NOTE — Progress Notes (Signed)
[facility] MD Progress Note  10/16/2013 10:32 AM Debbie King  MRN:  [id number] Subjective:  Debbie King is a 18 year old female who was brought in to [facility] by a stranger after being found walking on [address]. At that time the patient expressed suicidal ideations. Patient states today "I get these obsessive thoughts. I wanted to walk in front of a car. I feel like I will be dead within a year. I am falling apart.  I just feel that I need coping skills." The patient reports the symptoms have been getting worse for four months. She admits that her symptoms have become very serious. P [name] seems very hesitant about receiving treatment. Her admission lab-work shows no abnormalities. Patient reports that she gets so stressed by her depressive symptoms that she starts "Talking to myself. I don't hear voices. I just try to make my thoughts go away."   "My body is always tired, I have ear pain, muscle twitches, weird thoughts that want go away and sometimes I start talking to myself which has been going on for quite some time now. I have a slow reaction to things and delay in speech". Currently rates her depression 5/10 (down from a 10/10), anxiety 5/10, and hopelessness 10/10"definitely a ten because I feel like no one is giving me what I want".   Objective: Pt seen and chart reviewed. She has not been compliant with medications, she feels as though this may not be the correct diagnosis for her and she doesn't want to take any medications at this time. She is actively participating in group and milieu therapy.   Diagnosis:   DSM5: Schizophrenia Disorders:   Obsessive-Compulsive Disorders:   Trauma-Stressor Disorders:   Substance/Addictive Disorders:   Depressive Disorders:  Major Depressive Disorder - Severe (296.23) Total Time spent with patient: 30 minutes  Axis I: Major Depression, Recurrent severe Axis II: Deferred Axis IV: other psychosocial or environmental problems, problems related to social  environment and problems with primary support group Axis V: 31-40 impairment in reality testing  ADL's:  Intact  Sleep: Good, sleep too much  Appetite:  Good  Suicidal Ideation:  Plan:  Denies Intent:  Denies Means:  Denies Homicidal Ideation:  Plan:  Denies Intent:  Denies Means:  Denies AEB (as evidenced by):  Psychiatric Specialty Exam: Physical Exam  Constitutional: She is oriented to person, place, and time. She appears well-developed.  Musculoskeletal: Normal range of motion.  Neurological: She is alert and oriented to person, place, and time.  Skin: Skin is warm.  Moderate acne  Psychiatric: She has a normal mood and affect. Her behavior is normal.    Review of Systems  Psychiatric/Behavioral: Positive for depression, suicidal ideas and hallucinations. Negative for memory loss and substance abuse. The patient is nervous/anxious. The patient does not have insomnia.   All other systems reviewed and are negative.   Blood pressure 97/59, pulse 97, temperature 98.4 F (36.9 C), temperature source Oral, resp. rate 18, height  (1.6 m), weight 64.411 kg (142 lb), last menstrual period 10/12/2013.Body mass index is 25.16 kg/(m^2).  General Appearance: Fairly Groomed  Patent attorney::  Minimal  Speech:  Pressured and Slow  Volume:  Normal  Mood:  Anxious and Depressed  Affect:  Depressed and Flat  Thought Process:  Circumstantial and Intact  Orientation:  Full (Time, Place, and Person)  Thought Content:  WDL  Suicidal Thoughts:  No  Homicidal Thoughts:  No  Memory:  Immediate;   Good Recent;  Fair Remote;   Fair  Judgement:  Intact  Insight:  Fair  Psychomotor Activity:  Normal  Concentration:  Fair  Recall:  [employer] of Knowledge:Fair  Language: Good  Akathisia:  No  Handed:  Right  AIMS (if indicated):     Assets:  Communication Skills Housing Physical Health  Sleep:  Number of Hours: 5   Musculoskeletal: Strength & Muscle Tone: within normal  limits Gait & Station: normal Patient leans: N/A  Current Medications: Current Facility-Administered Medications  Medication Dose Route Frequency Provider Last Rate Last Dose  . acetaminophen (TYLENOL) tablet 650 mg  650 mg Oral Q6H PRN Nanine Means, NP      . alum & mag hydroxide-simeth (MAALOX/MYLANTA) 200-200-20 MG/5ML suspension 30 mL  30 mL Oral Q4H PRN Nanine Means, NP      . FLUoxetine (PROZAC) capsule 20 mg  20 mg Oral Daily Nanine Means, NP      . magnesium hydroxide (MILK OF MAGNESIA) suspension 30 mL  30 mL Oral Daily PRN Nanine Means, NP      . nicotine (NICODERM CQ - dosed in mg/24 hours) patch 21 mg  21 mg Transdermal Daily Nanine Means, NP      . OLANZapine zydis (ZYPREXA) disintegrating tablet 5 mg  5 mg Oral Q8H PRN Beau Fanny, FNP       Or  . OLANZapine (ZYPREXA) injection 5 mg  5 mg Intramuscular Q8H PRN Beau Fanny, FNP      . zolpidem (AMBIEN) tablet 5 mg  5 mg Oral QHS PRN Nanine Means, NP        Lab Results: No results found for this or any previous visit (from the past 48 hour(s)).  Physical Findings: AIMS: Facial and Oral Movements Muscles of Facial Expression: None, normal Lips and Perioral Area: None, normal Jaw: None, normal Tongue: None, normal,Extremity Movements Upper (arms, wrists, hands, fingers): None, normal Lower (legs, knees, ankles, toes): None, normal, Trunk Movements Neck, shoulders, hips: None, normal, Overall Severity Severity of abnormal movements (highest score from questions above): None, normal Incapacitation due to abnormal movements: None, normal Patient's awareness of abnormal movements (rate only patient's report): No Awareness, Dental Status Current problems with teeth and/or dentures?: No Does patient usually wear dentures?: No  CIWA:    COWS:     Treatment Plan Summary: Daily contact with patient to assess and evaluate symptoms and progress in treatment Medication management  Plan: 1. Admit for crisis management and  stabilization. Estimated length of stay 5-7 days.  2. Medication management to reduce current symptoms to base line and improve the patient's level of functioning. She is encouraged to start taking Prozac for depressive symptoms.  3. Develop treatment plan to decrease risk of relapse upon discharge of depressive symptoms and the need for readmission.  5. Group therapy to facilitate development of healthy coping skills to use for depression and anxiety.  6. Health care follow up as needed for medical problems.  7. Discharge plan to include therapy to help patient cope with stressors.  8. Call for Consult with Hospitalist for additional specialty patient services as needed. 9. Will obtain TSH, Free T4 r/o thyroid abnormality and B12 levels. Not hx of slowed speech, fatigue, lethargy, depressed mood and muscle twitching.  Medical Decision Making Problem Points:  Established problem, worsening (2), Review of last therapy session (1) and Review of psycho-social stressors (1) Data Points:  Review or order clinical lab tests (1) Review or order medicine tests (1) Review  and summation of old records (2) Review of medication regiment & side effects (2) Review of new medications or change in dosage (2)  I certify that inpatient services furnished can reasonably be expected to improve the patient's condition.   Malachy Chamber S FNP-BC 10/16/2013, 10:32 AM I agreed with findings and treatment plan of this patient

## 2013-10-16 NOTE — Progress Notes (Signed)
Met with pt 1:1. She complains of a headache of a 5/10 but refuses tylenol. "Ever since the Mirena IUD I had, which I know caused me anxiety and depression, I don't trust pharmaceuticals. It was after the IUD was removed that I began to have these thoughts that are continuous. I talk back to them. It is my voice that I hear and I can't stop it." Educated patient about mental illness and that trying medications would most likely benefit her symptoms. Drew the comparison of medical issues that require meds however patient is still resistant. She continues to endorse SI stating, "I will only try it if I know I can be successful. And there is no means for me to be successful here. But not being able to get rid of these thoughts makes me think the only solution is to kill myself." She does verbally contract for safety. Denies VH/HI. Will continue to monitor closely. Jamie Kato

## 2013-10-16 NOTE — Progress Notes (Signed)
Patient asked to speak with this writer around 2200. Patient states, "I think I've reached my breaking point. I just can't keep having these thoughts and the physical symptoms I'm having. Maybe I should try the prozac. I've tried other meds like lexapro, citalapram, klonopin but nothing has helped. I just feel like nothing is going to help." Patient agreed to try prozac but said, "I just don't want this to kill me. I don't want to die." Pt reassured about the safety of the medication and its well documented hx of low side effect risk. Praised patient for trust medical staff. Also provided education about available ambien and zyprexa and pt stated she would consider taking those but "just not now." Confirmed with patient that her verbal contract for safety earlier in the evening was still the case and she confirmed it was with good eye contact. Will monitor for side effects. Lawrence Marseilles

## 2013-10-16 NOTE — Progress Notes (Signed)
Patient ID: Debbie King, female   DOB: Feb 18, 1995, 18 y.o.   MRN: [id number]   Type of Therapy:  Psychoeducational Skills- Self Inventory Group  Participation Level:  Did Not Attend

## 2013-10-16 NOTE — Plan of Care (Signed)
Problem: Diagnosis: Increased Risk For Suicide Attempt Goal: STG-Patient Will Comply With Medication Regime Outcome: Not Progressing Patient reports that she does not care to take medications currently.

## 2013-10-16 NOTE — Progress Notes (Signed)
Debbie King remains aloof, uncomfortable, isolative and disconnected. She has spent most of the day in her bed...with her covers pulled up over her head.    A She refuses medication and requests to be moved to the 400 hall. She speaks about her " thoughts coming out of her head...and then you know what I'm thinking". She demonstrates thought blocking, she appears to be responding to internal stimuli and she has difficulty expressing her thoughts.   R Safety is in place and per her request, she is relocated to a room on the 400 hall.

## 2013-10-16 NOTE — [facility] Group Notes (Signed)
[facility] Group Notes:  (Clinical Social Work)  10/16/2013  10:00-11:00AM  Summary of Progress/Problems:   The main focus of today's process group was to   identify the patient's current support system and decide on other supports that can be put in place.   An emphasis was placed on using counselor, doctor, therapy groups, 12-step groups, and problem-specific support groups to expand supports.   There was also an extensive discussion about what constitutes a healthy support versus an unhealthy support.  The patient did not join the group until the last 10 minutes.  She then wanted to tell the smokers in the group how they can stop smoking cigarettes if they just think about their children.  She had poor eye contact and no insight.  Type of Therapy:  Process Group with Motivational Interviewing  Participation Level:  None  Participation Quality:  Intrusive  Affect:  Depressed and Flat  Cognitive:  Disorganized  Insight:  Limited  Engagement in Therapy:  Engaged  Modes of Intervention:   Education, Support and Processing, Activity  [employer], LCSW 10/16/2013, 12:15pm

## 2013-10-16 NOTE — Plan of Care (Signed)
Problem: Alteration in mood Goal: STG-Patient is able to discuss feelings and issues (Patient is able to discuss feelings and issues leading to depression)  Outcome: Progressing Patient keeps to self at times however 1:1 is able to describe her thoughts, feelings and hopelessness due to her current symptoms and SI. Goal: STG-Patient reports thoughts of self-harm to staff Outcome: Progressing Patient is open and honest with her passive SI. States she would only "do it if I could be successful and there is no means here for success." Contracts verbally for safety.

## 2013-10-16 NOTE — Progress Notes (Signed)
Adult Psychoeducational Group Note  Date:  10/16/2013 Time:  9:25 PM  Group Topic/Focus:  Wrap-Up Group:   The focus of this group is to help patients review their daily goal of treatment and discuss progress on daily workbooks.  Participation Level:  Active  Participation Quality:  Appropriate  Affect:  Appropriate  Cognitive:  Appropriate  Insight: Appropriate  Engagement in Group:  Engaged  Modes of Intervention:  Discussion  Additional Comments:  Pt attended wrap-up group this evening. Pt participate in group with peers.   Allex Madia A 10/16/2013, 9:25 PM 

## 2013-10-17 DIAGNOSIS — F333 Major depressive disorder, recurrent, severe with psychotic symptoms: Secondary | ICD-10-CM

## 2013-10-17 LAB — VITAMIN B12: VITAMIN B 12: 633 pg/mL (ref 211–911)

## 2013-10-17 LAB — T4, FREE: Free T4: 1.07 ng/dL (ref 0.80–1.80)

## 2013-10-17 MED ORDER — FLUOXETINE HCL 20 MG PO CAPS
20.0000 mg | ORAL_CAPSULE | Freq: Every day | ORAL | Status: DC
  Filled 2013-10-17 (×3): qty 1

## 2013-10-17 MED ORDER — ARIPIPRAZOLE 5 MG PO TABS
5.0000 mg | ORAL_TABLET | Freq: Two times a day (BID) | ORAL | Status: DC
  Administered 2013-10-17 – 2013-10-18 (×2): 5 mg via ORAL
  Filled 2013-10-17 (×8): qty 1

## 2013-10-17 MED ORDER — ONDANSETRON 4 MG PO TBDP: 4.0000 mg | ORAL_TABLET | Freq: Three times a day (TID) | ORAL | Status: DC | PRN

## 2013-10-17 MED ORDER — BENZTROPINE MESYLATE 0.5 MG PO TABS
0.5000 mg | ORAL_TABLET | Freq: Two times a day (BID) | ORAL | Status: DC
  Administered 2013-10-17 – 2013-10-19 (×4): 0.5 mg via ORAL
  Filled 2013-10-17 (×8): qty 1

## 2013-10-17 MED ORDER — TRAZODONE HCL 50 MG PO TABS
50.0000 mg | ORAL_TABLET | Freq: Every evening | ORAL | Status: DC | PRN
  Filled 2013-10-17: qty 14

## 2013-10-17 NOTE — Progress Notes (Signed)
D: Patient in her room on approach.  Patient states she had a good day.  Patient states she was concerned about her medications but states she got all of her questions answered.  Patient states her goal for today was to take her medications.  Patient states she took her medications.  Patient states denies SI/HI but states she does hear voices.  Patient states she gets depressed when she hears the voices.   A: Staff to monitor Q 15 mins for safety.  Encouragement and support offered.  No scheduled medications administered per orders. R: Patient remains safe on the unit.  Patient did not attend group tonight.  Patient visible on the unit after group.  Patient had not administered medications tonight.

## 2013-10-17 NOTE — Progress Notes (Signed)
[facility] MD Progress Note  10/17/2013 11:38 AM Debbie King  MRN:  [id number] Subjective:  Debbie King is a 18 year old female who was brought in to [facility] by a stranger after being found walking on [address]. At that time the patient expressed suicidal ideations. Patient states today "I am feeling OK ,reports passive  SI ,contracts for safety,denies VH/HI. Patient continues to report AH . Patient is concerned about her prozac,reports that it makes her "nauseaous". Patient reports she does not want to take a lot of medications all at once.  Objective: Pt seen and chart reviewed. She continues to be depressed ,anxious ,has an anxious affect. Continues to be noncompliant on medications. She is actively participating in group and milieu therapy.   Diagnosis:   DSM5: Primary Psychiatric diagnosis: Major depressive disorder ,recurrent ,severe with psychosis  Secondary psychiatric diagnosis: Non compliance with treatment        Total Time spent with patient: 30 minutes  ADL's:  Intact  Sleep: Good  Appetite:  Good   Psychiatric Specialty Exam: Physical Exam  Constitutional: She is oriented to person, place, and time. She appears well-developed.  Musculoskeletal: Normal range of motion.  Neurological: She is alert and oriented to person, place, and time.  Skin: Skin is warm.  Moderate acne  Psychiatric: She has a normal mood and affect. Her behavior is normal.    Review of Systems  Psychiatric/Behavioral: Positive for depression, suicidal ideas (passive) and hallucinations. Negative for memory loss and substance abuse. The patient is nervous/anxious. The patient does not have insomnia.   All other systems reviewed and are negative.   Blood pressure 107/64, pulse 74, temperature 98.7 F (37.1 C), temperature source Oral, resp. rate 16, height  (1.6 m), weight 64.411 kg (142 lb), last menstrual period 10/12/2013.Body mass index is 25.16 kg/(m^2).  General Appearance: Fairly  Groomed  Patent attorney::  Minimal  Speech:  Pressured and Slow  Volume:  Normal  Mood:  Anxious and Depressed  Affect:  Depressed and Flat  Thought Process:  Circumstantial and Intact  Orientation:  Full (Time, Place, and Person)  Thought Content:  WDL  Suicidal Thoughts:  Yes.  without intent/plan passive SI   Homicidal Thoughts:  No  Memory:  Immediate;   Good Recent;   Fair Remote;   Fair  Judgement:  Intact  Insight:  Fair  Psychomotor Activity:  Normal  Concentration:  Fair  Recall:  [employer] of Knowledge:Fair  Language: Good  Akathisia:  No  Handed:  Right  AIMS (if indicated):     Assets:  Communication Skills Housing Physical Health  Sleep:  Number of Hours: 6.25   Musculoskeletal: Strength & Muscle Tone: within normal limits Gait & Station: normal Patient leans: N/A  Current Medications: Current Facility-Administered Medications  Medication Dose Route Frequency Provider Last Rate Last Dose  . acetaminophen (TYLENOL) tablet 650 mg  650 mg Oral Q6H PRN Nanine Means, NP      . alum & mag hydroxide-simeth (MAALOX/MYLANTA) 200-200-20 MG/5ML suspension 30 mL  30 mL Oral Q4H PRN Nanine Means, NP      . ARIPiprazole (ABILIFY) tablet 5 mg  5 mg Oral BID Jomarie Longs, MD   5 mg at 10/17/13 1030  . benztropine (COGENTIN) tablet 0.5 mg  0.5 mg Oral BID Jomarie Longs, MD   0.5 mg at 10/17/13 1030  . [START ON 10/18/2013] FLUoxetine (PROZAC) capsule 20 mg  20 mg Oral Q breakfast Jomarie Longs, MD      .  magnesium hydroxide (MILK OF MAGNESIA) suspension 30 mL  30 mL Oral Daily PRN Nanine Means, NP      . nicotine (NICODERM CQ - dosed in mg/24 hours) patch 21 mg  21 mg Transdermal Daily Nanine Means, NP      . OLANZapine zydis (ZYPREXA) disintegrating tablet 5 mg  5 mg Oral Q8H PRN Beau Fanny, FNP       Or  . OLANZapine (ZYPREXA) injection 5 mg  5 mg Intramuscular Q8H PRN Beau Fanny, FNP      . ondansetron (ZOFRAN-ODT) disintegrating tablet 4 mg  4 mg Oral Q8H PRN  Jomarie Longs, MD      . traZODone (DESYREL) tablet 50 mg  50 mg Oral QHS PRN Jomarie Longs, MD        Lab Results:  Results for orders placed during the hospital encounter of 10/14/13 (from the past 48 hour(s))  VITAMIN B12     Status: None   Collection Time    10/16/13  7:25 PM      Result Value Ref Range   Vitamin B-12 633  211 - 911 pg/mL   Comment: Performed at [facility]  TSH     Status: None   Collection Time    10/16/13  7:25 PM      Result Value Ref Range   TSH 1.630  0.350 - 4.500 uIU/mL   Comment: Performed at [facility]  T4, FREE     Status: None   Collection Time    10/16/13  7:25 PM      Result Value Ref Range   Free T4 1.07  0.80 - 1.80 ng/dL   Comment: Performed at [facility]    Physical Findings: AIMS: Facial and Oral Movements Muscles of Facial Expression: None, normal Lips and Perioral Area: None, normal Jaw: None, normal Tongue: None, normal,Extremity Movements Upper (arms, wrists, hands, fingers): None, normal Lower (legs, knees, ankles, toes): None, normal, Trunk Movements Neck, shoulders, hips: None, normal, Overall Severity Severity of abnormal movements (highest score from questions above): None, normal Incapacitation due to abnormal movements: None, normal Patient's awareness of abnormal movements (rate only patient's report): No Awareness, Dental Status Current problems with teeth and/or dentures?: No Does patient usually wear dentures?: No  CIWA:    COWS:     Treatment Plan Summary: Daily contact with patient to assess and evaluate symptoms and progress in treatment Medication management  Plan: 1. Continue admission  for crisis management and stabilization. Estimated length of stay 5-7 days.  2. Will continue Prozac as scheduled for depression. Will change dosing to be taken with meals to reduce nausea.     Will add Abilify 5 mg po bid for psychosis.     Will provide prn's for anxiety ,sleep as well as  agitation. 3. Develop treatment plan to decrease risk of relapse upon discharge of depressive symptoms and the need for readmission.  5. Group therapy to facilitate development of healthy coping skills to use for depression and anxiety.  6. Health care follow up as needed for medical problems.  7. Discharge plan to include therapy to help patient cope with stressors.  8. Call for Consult with Hospitalist for additional specialty patient services as needed. 9. Labs reviewed -WNL. (10/17/13 -TSH,FreeT4,Vitamin B12)  Medical Decision Making Problem Points:  Established problem, worsening (2), Review of last therapy session (1) and Review of psycho-social stressors (1) Data Points:  Review or order clinical lab tests (1) Review or  order medicine tests (1) Review and summation of old records (2) Review of medication regiment & side effects (2) Review of new medications or change in dosage (2)  I certify that inpatient services furnished can reasonably be expected to improve the patient's condition.   Ryley Bachtel,[place]9/14/2015, 11:38 AM MD

## 2013-10-17 NOTE — Tx Team (Signed)
  Interdisciplinary Treatment Plan Update   Date Reviewed:  10/17/2013  Time Reviewed:  8:22 AM  Progress in Treatment:   Attending groups: Yes Participating in groups: Yes Taking medication as prescribed: Reluctantly Tolerating medication: Yes Family/Significant other contact made: No  Patient understands diagnosis: Yes AEB asking for help with AH [voices] Discussing patient identified problems/goals with staff: Yes  See initial care plan Medical problems stabilized or resolved: Yes Denies suicidal/homicidal ideation: Yes  In tx team Patient has not harmed self or others: Yes  For review of initial/current patient goals, please see plan of care.  Estimated Length of Stay:  4-5 days  Reason for Continuation of Hospitalization: Depression Hallucinations Medication stabilization  New Problems/Goals identified:  N/A  Discharge Plan or Barriers:   states she will not return to [facility].  Likely go stay with family in [place]  Follow up outpt  Additional Comments:  "I was so depressed that I was brought here."  History of Present Illness::  Debbie King is a 18 year old female who was brought in to [facility] by a stranger after being found walking on [address]. At that time the patient expressed suicidal ideations. Patient states today "I get these obsessive thoughts. I wanted to walk in front of a car. I feel like I will be dead within a year. I am falling apart. I feel tired all the time and my body twitches. I took medication before and had a bad reaction. I started itching. I just feel that I need coping skills." The patient reports the symptoms have been getting worse for four months. She admits that her symptoms have become very serious. Patient spoke with Dr. Gilmore Laroche about trying a different antidepressant such as Prozac. Debbie King seems very hesitant about receiving treatment.   Attendees:  Signature: Ivin Booty, MD 10/17/2013 8:22 AM   Signature: Richelle Ito, LCSW  10/17/2013 8:22 AM  Signature: Fransisca Kaufmann, NP 10/17/2013 8:22 AM  Signature: Joslyn Devon, RN 10/17/2013 8:22 AM  Signature: Liborio Nixon, RN 10/17/2013 8:22 AM  Signature:  10/17/2013 8:22 AM  Signature:   10/17/2013 8:22 AM  Signature:    Signature:    Signature:    Signature:    Signature:    Signature:      Scribe for Treatment Team:   Richelle Ito, LCSW  10/17/2013 8:22 AM

## 2013-10-17 NOTE — Progress Notes (Signed)
Patient ID: Debbie King, female   DOB: Oct 25, 1995, 18 y.o.   MRN: [id number] D: Patient is resistant to treatment.  She refused her prozac at 0800 and came back to take it an hour later.  Patient has several complaints about the medication such as low blood pressure, sedation and feeling "not right."  Patient continues to report auditory hallucinations and passive SI.  She has contracted for safety on the unit.  She is questioning her "blood work."  She doesn't want to take her meds twice a day, preferring to take them all at once.  Patient remains isolative with minimal interaction with staff and her peers.  She did attend discharge planning group this morning. A: Continue to monitor medication management and MD orders.  Safety checks completed every 15 minutes per protocol. R: Patient is cooperative; her behavior has been appropriate.

## 2013-10-17 NOTE — [facility] Group Notes (Signed)
[facility] LCSW Group Therapy  10/17/2013 1:15 pm  Type of Therapy: Process Group Therapy  Participation Level:  Active  Participation Quality:  Appropriate  Affect:  Flat  Cognitive:  Oriented  Insight:  Improving  Engagement in Group:  Limited  Engagement in Therapy:  Limited  Modes of Intervention:  Activity, Clarification, Education, Problem-solving and Support  Summary of Progress/Problems: Today's group addressed the issue of overcoming obstacles.  Patients were asked to identify their biggest obstacle post d/c that stands in the way of their on-going success, and then problem solve as to how to manage this.  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  "I quit cold [place], and it was not easy, but I knew I needed to stop because bad things were starting to happen."  We talked about her as a "stubborn" person, which she identified as negative, but the rest of the group pointed out that it allowed her to overcome her addiction, which is a positive thing.  Her current obstacle is "figuring out the next step since I need to take a break from school for awhile.  It's just not a good fit right now."  That's as far as the problem solving went.  Daryel Gerald B 10/17/2013   5:03 PM

## 2013-10-18 MED ORDER — HALOPERIDOL 2 MG PO TABS
2.0000 mg | ORAL_TABLET | Freq: Two times a day (BID) | ORAL | Status: DC
  Administered 2013-10-18 – 2013-10-19 (×2): 2 mg via ORAL
  Filled 2013-10-18 (×4): qty 1

## 2013-10-18 MED ORDER — MIRTAZAPINE 7.5 MG PO TABS
7.5000 mg | ORAL_TABLET | Freq: Every day | ORAL | Status: DC
  Administered 2013-10-18: 7.5 mg via ORAL
  Filled 2013-10-18 (×2): qty 1

## 2013-10-18 NOTE — Progress Notes (Addendum)
Pt refused to take her medication prozac this am.She stated,"how much longer will I have to be on this head medicine.?" MD made aware. Pt asks a lot of questions and is slow to take medications . She spent 5 minutes at the med window looking at the medication and deciding if she would take it or not. Pt denies hearing voices. She denies SI or HI and contracts for safety.5pm -pt is very reluctant to take any medications and needs a lot of prompting . She did not want to take two pills stating,"I do not like medicine and will only take one. " After about 5 minutes at the med window the pt did take both pills. Pt stated on her self inventory that,"I want to stop the slow reaction time and not look at anyone in the eyes,Pt also stated,"I want to take a different medication . 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 Mom also stated one am she found a strange man in the pts room who must have come from the strip joint. Mom stated her daughter withdrew $5000.00 and went to [place] and rented an apartment and bought a mattress. Mom stated they filed a missing persons report and that she believes her daughter did not want to be found. 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 "

## 2013-10-18 NOTE — Plan of Care (Signed)
Problem: Alteration in mood Goal: LTG-Patient reports reduction in suicidal thoughts (Patient reports reduction in suicidal thoughts and is able to verbalize a safety plan for whenever patient is feeling suicidal)  Outcome: Progressing Pt states she does not have any SI today.

## 2013-10-18 NOTE — Progress Notes (Signed)
An otoscope was obtained from the Adolescent unit. I examined bilateral ears and they were pearly gray, translucent, TM intact, with good light reflex. No erythema, edema, or fluid present. Moderate cerumen present in right ear canal, scant cerumen in left ear canal. Denies tenderness during exam. Source of pt's left ear pain remains unknown but pt does report that it is gradually subsiding.    [place], Everardo All, FNP-BC

## 2013-10-18 NOTE — Discharge Instructions (Signed)
°  Scheduled Meds:  ARIPiprazole  5 mg Oral BID   benztropine  0.5 mg Oral BID   mirtazapine  7.5 mg Oral QHS   nicotine  21 mg Transdermal Daily   Continuous Infusions:  PRN Meds:acetaminophen, alum & mag hydroxide-simeth, magnesium hydroxide, OLANZapine, OLANZapine zydis, ondansetron, traZODone  Personal Items   Please collect all clothing which belongs to you from your nurse. Please collect any valuables you stored during your stay from the front desk, and please remember all of your personal items, such as dentures, canes, and eyeglasses.   Activity Instructions   You must avoid lifting more than *** pounds until your physician instructs you differently. You should avoid {d/c avoid/resume:120111}. You may resume {d/c avoid/resume:120111}.   I understand that if any problems occur once I am at home I am to contact my physician.  I understand and acknowledge receipt of the instructions indicated above.    _____________________________________________                                                       Physician's or R.N.'s Signature                Date/Time                        _____________________________________________                                                       Patient or Representative Signature         Date/Time

## 2013-10-18 NOTE — Progress Notes (Signed)
D:  Pt passive AH-negative thoughts. Pt states she just tries to block them out. Pt denies SI/HI/VH. Pt is pleasant and cooperative. Pt stated the Haldol makes her sleepy and would like to take it only at night if possible. Pt stated she wanted to get herself right before going back to school. Pt still confrontational when taking medication, stalling and trying to be evasive.   A: Pt was offered support and encouragement. Pt was given scheduled medications. Pt was encourage to attend groups. Q 15 minute checks were done for safety.   R:Pt attends groups and interacts well with peers and staff. Pt is taking medication. Pt has no complaints at this time .Pt receptive to treatment and safety maintained on unit.

## 2013-10-18 NOTE — Progress Notes (Signed)
Adult Psychoeducational Group Note  Date:  10/18/2013 Time:  9:17 PM  Group Topic/Focus:  Wrap-Up Group:   The focus of this group is to help patients review their daily goal of treatment and discuss progress on daily workbooks.  Participation Level:  Minimal  Participation Quality:  Appropriate  Affect:  Blunted  Cognitive:  Oriented  Insight: Limited  Engagement in Group:  Limited  Modes of Intervention:  Socialization and Support  Additional Comments:  Patient attended and participated in group tonight. She reports having an OK day. Her medication was changed and that made her tired and sleepy. Today her mother and boyfriend visited with her.   Lita Mains [facility] 10/18/2013, 9:17 PM

## 2013-10-18 NOTE — [facility] Group Notes (Signed)
[facility] LCSW Group Therapy  10/18/2013 11:37 AM  Type of Therapy:  Group Therapy  Participation Level:  Minimal  Participation Quality:  Drowsy  Affect:  Flat and Lethargic  Cognitive:  Lacking  Insight:  Limited  Engagement in Therapy:  Limited  Modes of Intervention:  Confrontation, Discussion, Education, Exploration, Limit-setting, Orientation, Rapport Building, Dance movement psychotherapist, Socialization and Support  Summary of Progress/Problems: Feelings around Diagnosis: Debbie King was innattentive and appeared lethargic during most of today's processing group. When called upon by CSW, she was able to identify the pros and cons of being diagnosed with a mental health disorder. She stated that overall, her diagnosis has negative implications for her "It means there is something wrong with me." She shows progress in the group setting with limited insight AEB her difficulty in articulating why she did not like having a mental health disorder. "taking medications is annoying and frustrating." She stated that she believed her MH diagnosis was wrong but could not identify why.   Smart, Debbie King LCSWA 10/18/2013, 11:37 AM

## 2013-10-18 NOTE — Progress Notes (Addendum)
[facility] MD Progress Note  10/18/2013 12:03 PM Debbie King  MRN:  [id number] Subjective:  Debbie King is a 18 year old female who was brought in to [facility] by a stranger after being found walking on [address]. At that time the patient expressed suicidal ideations. Patient reports she is afraid to be on medications for long term and wants to know when she can stop taking her abilify. She also wants to know how long she has to be on Prozac . She reports prozac makes her nauseous and tired.  Objective: Pt seen and chart reviewed. Patient found in bed .She continues to be depressed ,anxious , and withdrawn.  Continues to be noncompliant on medications. Reports sleep as fair as well as appetite as fair .Marland Kitchen Denies any AH/VH/SI/HI . Per staff patient has been refusing her medications ,since she feels she does not want to be on them for long term. She refused her prozac today.  Patient educated about taking her medications for her symptoms for long term .Discussed with her that her outpatient psychiatrist could decide when she could be taken of her abilify ,once she is stable.   Diagnosis:   DSM5: Primary Psychiatric diagnosis: Major depressive disorder ,recurrent ,severe with psychosis  Secondary psychiatric diagnosis: Non compliance with treatment        Total Time spent with patient: 30 minutes  ADL's:  Intact  Sleep: Good  Appetite:  Good   Psychiatric Specialty Exam: Physical Exam  Constitutional: She is oriented to person, place, and time. She appears well-developed.  Musculoskeletal: Normal range of motion.  Neurological: She is alert and oriented to person, place, and time.  Skin: Skin is warm.  Moderate acne  Psychiatric: She has a normal mood and affect. Her behavior is normal.    Review of Systems  Psychiatric/Behavioral: Positive for depression and hallucinations. Negative for suicidal ideas, memory loss and substance abuse. The patient is nervous/anxious and has  insomnia.   All other systems reviewed and are negative.   Blood pressure 113/68, pulse 104, temperature 97.6 F (36.4 C), temperature source Oral, resp. rate 20, height  (1.6 m), weight 64.411 kg (142 lb), last menstrual period 10/12/2013.Body mass index is 25.16 kg/(m^2).  General Appearance: Fairly Groomed  Patent attorney::  Minimal  Speech:  Slow  Volume:  Normal  Mood:  Anxious and Depressed, withdrawn  Affect:  Depressed and Flat  Thought Process:  Circumstantial and Intact  Orientation:  Full (Time, Place, and Person)  Thought Content:  WDL  Suicidal Thoughts:  No   Homicidal Thoughts:  No  Memory:  Immediate;   Good Recent;   Fair Remote;   Fair  Judgement:  Intact  Insight:  Fair  Psychomotor Activity:  Normal  Concentration:  Fair  Recall:  [employer] of Knowledge:Fair  Language: Good  Akathisia:  No  Handed:  Right  AIMS (if indicated):     Assets:  Communication Skills Housing Physical Health  Sleep:  Number of Hours: 5.75   Musculoskeletal: Strength & Muscle Tone: within normal limits Gait & Station: normal Patient leans: N/A  Current Medications: Current Facility-Administered Medications  Medication Dose Route Frequency Provider Last Rate Last Dose  . acetaminophen (TYLENOL) tablet 650 mg  650 mg Oral Q6H PRN Nanine Means, NP      . alum & mag hydroxide-simeth (MAALOX/MYLANTA) 200-200-20 MG/5ML suspension 30 mL  30 mL Oral Q4H PRN Nanine Means, NP      . ARIPiprazole (ABILIFY) tablet 5  mg  5 mg Oral BID Jomarie Longs, MD   5 mg at 10/18/13 0800  . benztropine (COGENTIN) tablet 0.5 mg  0.5 mg Oral BID Jomarie Longs, MD   0.5 mg at 10/18/13 0840  . magnesium hydroxide (MILK OF MAGNESIA) suspension 30 mL  30 mL Oral Daily PRN Nanine Means, NP      . mirtazapine (REMERON) tablet 7.5 mg  7.5 mg Oral QHS Timara Loma, MD      . nicotine (NICODERM CQ - dosed in mg/24 hours) patch 21 mg  21 mg Transdermal Daily Nanine Means, NP      . OLANZapine zydis  (ZYPREXA) disintegrating tablet 5 mg  5 mg Oral Q8H PRN Beau Fanny, FNP       Or  . OLANZapine (ZYPREXA) injection 5 mg  5 mg Intramuscular Q8H PRN Beau Fanny, FNP      . ondansetron (ZOFRAN-ODT) disintegrating tablet 4 mg  4 mg Oral Q8H PRN Jomarie Longs, MD      . traZODone (DESYREL) tablet 50 mg  50 mg Oral QHS PRN Jomarie Longs, MD        Lab Results:  Results for orders placed during the hospital encounter of 10/14/13 (from the past 48 hour(s))  VITAMIN B12     Status: None   Collection Time    10/16/13  7:25 PM      Result Value Ref Range   Vitamin B-12 633  211 - 911 pg/mL   Comment: Performed at [facility]  TSH     Status: None   Collection Time    10/16/13  7:25 PM      Result Value Ref Range   TSH 1.630  0.350 - 4.500 uIU/mL   Comment: Performed at [facility]  T4, FREE     Status: None   Collection Time    10/16/13  7:25 PM      Result Value Ref Range   Free T4 1.07  0.80 - 1.80 ng/dL   Comment: Performed at [facility]    Physical Findings: AIMS: Facial and Oral Movements Muscles of Facial Expression: None, normal Lips and Perioral Area: None, normal Jaw: None, normal Tongue: None, normal,Extremity Movements Upper (arms, wrists, hands, fingers): None, normal Lower (legs, knees, ankles, toes): None, normal, Trunk Movements Neck, shoulders, hips: None, normal, Overall Severity Severity of abnormal movements (highest score from questions above): None, normal Incapacitation due to abnormal movements: None, normal Patient's awareness of abnormal movements (rate only patient's report): No Awareness, Dental Status Current problems with teeth and/or dentures?: No Does patient usually wear dentures?: No  CIWA:    COWS:     Treatment Plan Summary: Daily contact with patient to assess and evaluate symptoms and progress in treatment Medication management  Plan: 1. Continue admission for treatment   2. Will DC prozac for side  effects (nausea) ,and patient refusing to take it.     Will start Remeron 7.5 mg po qhs for depression as well as sleep. Patient agrees to take Remeron.     Will discontinue Abilify since no PAP available for patient. Will start Haldol 2 mg po bid for psychosis.Will continue cogentin for EPS.     Will provide prn's for anxiety ,sleep as well as agitation. 3. Develop treatment plan to decrease risk of relapse upon discharge of depressive symptoms and the need for readmission.  5. Group therapy to facilitate development of healthy coping skills to use for depression and anxiety.  6. Health care follow up as needed for medical problems.  7. Discharge plan to include therapy to help patient cope with stressors.  8. Call for Consult with Hospitalist for additional specialty patient services as needed. 9. Labs reviewed -WNL. (10/17/13 -TSH,FreeT4,Vitamin B12)  Medical Decision Making Problem Points:  Established problem, worsening (2), Review of last therapy session (1) and Review of psycho-social stressors (1) Data Points:  Review or order clinical lab tests (1) Review or order medicine tests (1) Review and summation of old records (2) Review of medication regiment & side effects (2) Review of new medications or change in dosage (2)  I certify that inpatient services furnished can reasonably be expected to improve the patient's condition.   Tericka Devincenzi,Saramma9/15/2015, 12:03 PM MD

## 2013-10-18 NOTE — [facility] Group Notes (Signed)
[facility] Group Notes: Goals group/Recovery  Date:  10/18/2013  Time:  10:57 AM  Type of Therapy:  Nurse Education  Participation Level:  Did Not Attend  Participation Quality:  Inattentive  Affect:  Flat  Cognitive:  Lacking  Insight:  None  Engagement in Group:  Lacking  Modes of Intervention:  Discussion  Summary of Progress/Problems:Pt did not attend  [name] [facility] 10/18/2013, 10:57 AM

## 2013-10-19 MED ORDER — HALOPERIDOL 5 MG PO TABS
5.0000 mg | ORAL_TABLET | Freq: Every evening | ORAL | Status: DC
  Administered 2013-10-19: 5 mg via ORAL
  Filled 2013-10-19 (×3): qty 1

## 2013-10-19 MED ORDER — BENZTROPINE MESYLATE 0.5 MG PO TABS
0.5000 mg | ORAL_TABLET | Freq: Every evening | ORAL | Status: DC
  Filled 2013-10-19 (×2): qty 1

## 2013-10-19 MED ORDER — BUPROPION HCL ER (XL) 150 MG PO TB24
150.0000 mg | ORAL_TABLET | Freq: Every day | ORAL | Status: DC
  Filled 2013-10-19 (×3): qty 1

## 2013-10-19 MED ORDER — BUPROPION HCL ER (SR) 100 MG PO TB12
100.0000 mg | ORAL_TABLET | Freq: Once | ORAL | Status: AC
  Administered 2013-10-19: 100 mg via ORAL
  Filled 2013-10-19 (×2): qty 1

## 2013-10-19 MED ORDER — MIRTAZAPINE 15 MG PO TABS
15.0000 mg | ORAL_TABLET | Freq: Every day | ORAL | Status: DC
  Filled 2013-10-19: qty 1

## 2013-10-19 NOTE — [facility] Group Notes (Signed)
[facility] LCSW Group Therapy  10/19/2013 1:41 PM  Type of Therapy:  Group Therapy  Participation Level:  Invited. Did not attend.   Summary of Progress/Problems:[facility]) speaker came to talk about his personal journey with substance abuse and mental illness. Group members were challenged to process ways by which to relate to the speaker. [facility] speaker provided handouts and educational information pertaining to groups and services offered by the [facility].    King,Debbie 10/19/2013, 1:41 PM

## 2013-10-19 NOTE — Progress Notes (Signed)
Adult Psychoeducational Group Note  Date:  10/19/2013 Time:  9:42 PM  Group Topic/Focus:  Wrap-Up Group:   The focus of this group is to help patients review their daily goal of treatment and discuss progress on daily workbooks.  Participation Level:  Active  Participation Quality:  Appropriate  Affect:  Appropriate  Cognitive:  Appropriate  Insight: Appropriate  Engagement in Group:  Engaged  Modes of Intervention:  Discussion  Additional Comments: The patient expressed that she slept though group today.  Octavio Manns 10/19/2013, 9:42 PM

## 2013-10-19 NOTE — Progress Notes (Signed)
Patient ID: Debbie King, female   DOB: 02/20/1995, 18 y.o.   MRN: [id number] D: Patient endorses passive suicidal because pt feels her issues has not been addressed. Pt reports  for the past year she has had "slow thinking and reaction time". Pt reports she has forgotten to inform the provider of this issue. Pt denies auditory and visual hallucination. Pt attended evening wrap up group and engaged in discussion. No acute distressed noted at this time.   A: Met with pt 1:1.  Writer encouraged pt to discuss feelings. Pt encouraged to come to staff with any questions or concerns.   R: Patient is safe on the unit. Pt is able to discuss thoughts with staff.

## 2013-10-19 NOTE — Progress Notes (Signed)
[facility] MD Progress Note  10/19/2013 11:46 AM Debbie King  MRN:  [id number] Subjective:  Debbie King is a 18 year old female who was brought in to [facility] by a stranger after being found walking on [address]. At that time the patient expressed suicidal ideations. Patient reports that the medications are making her tired and continues to be suspicious of taking medications.  Objective: Pt seen and chart reviewed. Patient found in bed .She continues to be depressed ,anxious , and withdrawn. Reports feeling tired on her current medications. Reports Remeron is making her too tired and reports not being able to get out of bed today. Reports sleep as good as well as appetite as fair . Reports that she continues to hear voices ,but unable to elaborate them. Reports she is trying not to pay attention to voices.  Per staff patient has been refusing her medications ,since she feels they make her tired.  Patient educated about taking her medications for her symptoms for long term .  Spoke to mother who reports -patient had been talking in several voices as well as hearing voices since the past 2 months or so. Patient also had been talking in atleast 2-3 different kind of voices at [employer] when she was brought in to hospital. Mother reports she has bipolar do and is worried that her daughter may have schizophrenia or bipolar.   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 She appears well-developed.  Musculoskeletal: Normal range of motion.  Neurological: She is alert and oriented to person, place, and time.  Skin: Skin is warm.  Moderate acne   Psychiatric: She has a normal mood and affect. Her behavior is normal.    Review of Systems  Psychiatric/Behavioral: Positive for depression and hallucinations. Negative for suicidal ideas, memory loss and substance abuse. The patient is nervous/anxious and has insomnia.   All other systems reviewed and are negative.   Blood pressure 94/53, pulse 107, temperature 97.9 F (36.6 C), temperature source Oral, resp. rate 18, height  (1.6 m), weight 64.411 kg (142 lb), last menstrual period 10/12/2013.Body mass index is 25.16 kg/(m^2).  General Appearance: Fairly Groomed  Patent attorney::  Minimal  Speech:  Slow  Volume:  Normal  Mood:  Anxious and Depressed, withdrawn  Affect:  Depressed and Flat  Thought Process:  Circumstantial  Orientation:  Full (Time, Place, and Person)  Thought Content:  Hallucinations: Auditory  Suicidal Thoughts:  No   Homicidal Thoughts:  No  Memory:  Immediate;   Good Recent;   Fair Remote;   Fair  Judgement:  Intact  Insight:  Fair  Psychomotor Activity:  Normal  Concentration:  Fair  Recall:  [employer] of Knowledge:Fair  Language: Good  Akathisia:  No  Handed:  Right  AIMS (if indicated):     Assets:  Communication Skills Housing Physical Health  Sleep:  Number of Hours: 6.75   Musculoskeletal: Strength & Muscle Tone: within normal limits Gait & Station: normal Patient leans: N/A  Current Medications: Current Facility-Administered Medications  Medication Dose Route Frequency Provider Last Rate Last Dose  . acetaminophen (TYLENOL) tablet 650 mg  650 mg Oral  Q6H PRN Nanine Means, NP      . alum & mag hydroxide-simeth (MAALOX/MYLANTA) 200-200-20 MG/5ML suspension 30 mL  30 mL Oral Q4H PRN Nanine Means, NP      . Melene Muller ON 10/20/2013] benztropine (COGENTIN) tablet 0.5 mg  0.5 mg Oral QPM Gracelynn Bircher, MD      . haloperidol (HALDOL) tablet 5 mg  5 mg Oral QPM Nioma Mccubbins, MD      . magnesium hydroxide (MILK OF MAGNESIA) suspension 30 mL  30  mL Oral Daily PRN Nanine Means, NP      . mirtazapine (REMERON) tablet 15 mg  15 mg Oral QHS Tarron Krolak, MD      . OLANZapine zydis (ZYPREXA) disintegrating tablet 5 mg  5 mg Oral Q8H PRN Beau Fanny, FNP       Or  . OLANZapine (ZYPREXA) injection 5 mg  5 mg Intramuscular Q8H PRN Beau Fanny, FNP      . ondansetron (ZOFRAN-ODT) disintegrating tablet 4 mg  4 mg Oral Q8H PRN Jomarie Longs, MD      . traZODone (DESYREL) tablet 50 mg  50 mg Oral QHS PRN Jomarie Longs, MD        Lab Results:  No results found for this or any previous visit (from the past 48 hour(s)).  Physical Findings: AIMS: Facial and Oral Movements Muscles of Facial Expression: None, normal Lips and Perioral Area: None, normal Jaw: None, normal Tongue: None, normal,Extremity Movements Upper (arms, wrists, hands, fingers): None, normal Lower (legs, knees, ankles, toes): None, normal, Trunk Movements Neck, shoulders, hips: None, normal, Overall Severity Severity of abnormal movements (highest score from questions above): None, normal Incapacitation due to abnormal movements: None, normal Patient's awareness of abnormal movements (rate only patient's report): No Awareness, Dental Status Current problems with teeth and/or dentures?: No Does patient usually wear dentures?: No  CIWA:    COWS:     Treatment Plan Summary: Daily contact with patient to assess and evaluate symptoms and progress in treatment Medication management  Plan: 1. Continue admission for treatment   2. Will DC prozac for side effects (nausea) ,and patient refusing to take it.     Will DC Remeron. Will start a trial of Wellbutrin for lack of motivation ,low energy as well as being withdrawn.     Will change  Haldol to 5 mg po qpm since she has been complaining of feeling tired during day time.       Will provide prn's for anxiety ,sleep as well as agitation. 3. Develop treatment plan to decrease risk of relapse upon discharge of  depressive symptoms and the need for readmission.  5. Group therapy to facilitate development of healthy coping skills to use for depression and anxiety.  6. Health care follow up as needed for medical problems.  7. Discharge plan to include therapy to help patient cope with stressors.  8. Call for Consult with Hospitalist for additional specialty patient services as needed. 9. Labs reviewed -WNL. (10/17/13 -TSH,FreeT4,Vitamin B12)  Medical Decision Making Problem Points:  Established problem, worsening (2), Review of last therapy session (1) and Review of psycho-social stressors (1) Data Points:  Review or order clinical lab tests (1) Review or order medicine tests (1) Review and summation of old records (2) Review of medication regiment & side effects (2) Review of new medications or change in dosage (2)  I certify that inpatient services furnished can reasonably be expected to improve the patient's condition.   Serayah Yazdani,Saramma9/16/2015, 11:46  AM MD

## 2013-10-19 NOTE — Progress Notes (Signed)
D:  Per pt self inventory pt reports sleeping fair, appetite fair, energy level normal, ability to pay attention good, rates depression at a 10 and hopelessness at a 10 out of 10, anxiety 10  Out of 10, endorses SI on and off contracts for safety, pt's goal is "to feel better" plan to accomplish the goal is to "drink gatorade"--pt feels as though the medication that she took last night made her "groggy" today and that drinking lots of fluids with help to flush out her system,, pt was isolative today and did not go to groups but as the day progressed was more visible on the unit.--pt also states that she would like to talk with the doctor tomorrow about the fact that "I have slow thinking/reaction time and I feel like I am slowly dying"   A:  Emotional support provided, Encouraged pt to continue with treatment plan and attend all group activities, q15 min checks maintained for safety.  R:  Pt is anxious and depressed but pleasant with staff, needs encouragement to go to groups because she is having some paranoia--states that "people think I am staring at them."

## 2013-10-19 NOTE — [facility] Group Notes (Signed)
[facility] LCSW Aftercare Discharge Planning Group Note   10/19/2013 2:16 PM  Participation Quality:  Invited.  Chose to not attend, c/o sedation.    Debbie King

## 2013-10-20 DIAGNOSIS — F2081 Schizophreniform disorder: Secondary | ICD-10-CM

## 2013-10-20 MED ORDER — LORAZEPAM 0.5 MG PO TABS
0.5000 mg | ORAL_TABLET | Freq: Four times a day (QID) | ORAL | Status: DC | PRN
  Administered 2013-10-22 – 2013-10-26 (×3): 0.5 mg via ORAL
  Filled 2013-10-20 (×3): qty 1

## 2013-10-20 MED ORDER — MENTHOL 3 MG MT LOZG
1.0000 | LOZENGE | Freq: Every day | OROMUCOSAL | Status: DC | PRN
  Administered 2013-10-20: 3 mg via ORAL

## 2013-10-20 MED ORDER — DIPHENHYDRAMINE HCL 50 MG/ML IJ SOLN
25.0000 mg | Freq: Four times a day (QID) | INTRAMUSCULAR | Status: DC | PRN
  Administered 2013-10-20: 25 mg via INTRAMUSCULAR
  Filled 2013-10-20 (×2): qty 1

## 2013-10-20 MED ORDER — DIPHENHYDRAMINE HCL 25 MG PO CAPS: 25.0000 mg | ORAL_CAPSULE | Freq: Four times a day (QID) | ORAL | Status: DC | PRN

## 2013-10-20 NOTE — Plan of Care (Signed)
Problem: Alteration in mood Goal: STG-Patient is able to discuss feelings and issues (Patient is able to discuss feelings and issues leading to depression)  Outcome: Not Progressing Pt able to discuss effect of medication and any adverse reaction.

## 2013-10-20 NOTE — Progress Notes (Signed)
Adult Psychoeducational Group Note  Date:  10/20/2013 Time:  10:50 PM  Group Topic/Focus:  Wrap-Up Group:   The focus of this group is to help patients review their daily goal of treatment and discuss progress on daily workbooks.  Participation Level:  Did Not Attend  Participation Quality:  Drowsy  Affect:  Flat  Cognitive:  Confused  Insight: None  Engagement in Group:  None  Modes of Intervention:  None  Additional Comments:  Pt was not in group   Tangia Pinard R 10/20/2013, 10:50 PM

## 2013-10-20 NOTE — Progress Notes (Signed)
D:  Pt became very tearful when speaking to mother on the phone, pt states "I feel like there is something wrong inside my body and that it is shutting down and it is making me want to die", I just wish someone could tell me what is wrong with me, MD notified and is now present with patient.  A:  Emotional support provided, Encouraged pt to continue with treatment plan and attend all group activities, q15 min checks maintained for safety.  R:  Pt is isolative, refusing to take any medications states that "the medications don't sit right with my body"

## 2013-10-20 NOTE — [facility] Suicide Risk Assessment (Signed)
[facility] INPATIENT:  Family/Significant Other Suicide Prevention Education  Suicide Prevention Education:  Education Completed; Aubreyanna Dorrough, mother, [phone]  has been identified by the patient as the family member/significant other with whom the patient will be residing, and identified as the person(s) who will aid the patient in the event of a mental health crisis (suicidal ideations/suicide attempt).  With written consent from the patient, the family member/significant other has been provided the following suicide prevention education, prior to the and/or following the discharge of the patient.  The suicide prevention education provided includes the following:  Suicide risk factors  Suicide prevention and interventions  National Suicide Hotline telephone number  [facility] assessment telephone number  [facility] Emergency Assistance 911  [facility] and/or Residential Mobile Crisis Unit telephone number  Request made of family/significant other to:  Remove weapons (e.g., guns, rifles, knives), all items previously/currently identified as safety concern.    Remove drugs/medications (over-the-counter, prescriptions, illicit drugs), all items previously/currently identified as a safety concern.  The family member/significant other verbalizes understanding of the suicide prevention education information provided.  The family member/significant other agrees to remove the items of safety concern listed above.  Daryel Gerald B 10/20/2013, 5:30 PM

## 2013-10-20 NOTE — Progress Notes (Signed)
[facility] MD Progress Note  10/20/2013 1:26 PM PRESSLEY BARSKY  MRN:  [id number] Subjective:  Debbie King is a 18 year old female who was brought in to [facility] by a stranger after being found walking on [address]. At that time the patient expressed suicidal ideations. Patient reports that the medications are making her tired and continues to be suspicious of taking medications.  Objective: Pt seen and chart reviewed. Patient found in bed .She continues to be depressed ,anxious , and withdrawn. Reports feeling tired on her current medications and is resistant to take it daily. Reports she has been hearing voices ,unable to elaborate them. Reports hearing voices conversing with each other as well as thoughts that other people are controlling her thoughts .    Per staff patient has been refusing her medications ,since she feels they make her tired.  Patient educated about taking her medications for her symptoms for long term .Could hold her medications today since she is complaining of being sedated and tired.    Diagnosis:   DSM5: Primary Psychiatric diagnosis: Schizoaffective disorder, depressive type, multiple episodes ,currently in acute episode  Secondary psychiatric diagnosis: Non compliance with treatment        Total Time spent with patient: 30 minutes  ADL's:  Intact  Sleep: Good  Appetite:  Good   Psychiatric Specialty Exam: Physical Exam  Constitutional: She is oriented to person, place, and time. She appears well-developed.  Musculoskeletal: Normal range of motion.  Neurological: She is alert and oriented to person, place, and time.  Skin: Skin is warm.  Moderate acne  Psychiatric: She has a normal mood and affect. Her behavior is normal.    Review of Systems  Constitutional: Positive for malaise/fatigue.  Psychiatric/Behavioral: Positive for depression and hallucinations. Negative for suicidal ideas, memory loss and substance abuse. The patient is  nervous/anxious.   All other systems reviewed and are negative.   Blood pressure 94/58, pulse 92, temperature 98.5 F (36.9 C), temperature source Oral, resp. rate 16, height  (1.6 m), weight 64.411 kg (142 lb), last menstrual period 10/12/2013.Body mass index is 25.16 kg/(m^2).  General Appearance: Fairly Groomed  Patent attorney::  Minimal  Speech:  Slow  Volume:  Normal  Mood:  Anxious and Depressed, withdrawn  Affect:  Depressed and Flat  Thought Process:  Circumstantial  Orientation:  Full (Time, Place, and Person)  Thought Content:  Hallucinations: Auditory,voices conversing with each other and also talks to her  Suicidal Thoughts:  No   Homicidal Thoughts:  No  Memory:  Immediate;   Good Recent;   Fair Remote;   Fair  Judgement:  Intact  Insight:  Fair  Psychomotor Activity:  Normal  Concentration:  Fair  Recall:  [employer] of Knowledge:Fair  Language: Good  Akathisia:  No  Handed:  Right  AIMS (if indicated):     Assets:  Communication Skills Housing Physical Health  Sleep:  Number of Hours: 4.75   Musculoskeletal: Strength & Muscle Tone: within normal limits Gait & Station: normal Patient leans: N/A  Current Medications: Current Facility-Administered Medications  Medication Dose Route Frequency Provider Last Rate Last Dose  . acetaminophen (TYLENOL) tablet 650 mg  650 mg Oral Q6H PRN Nanine Means, NP      . alum & mag hydroxide-simeth (MAALOX/MYLANTA) 200-200-20 MG/5ML suspension 30 mL  30 mL Oral Q4H PRN Nanine Means, NP      . benztropine (COGENTIN) tablet 0.5 mg  0.5 mg Oral QPM Jomarie Longs, MD      .  buPROPion (WELLBUTRIN XL) 24 hr tablet 150 mg  150 mg Oral Daily Prateek Knipple, MD      . haloperidol (HALDOL) tablet 5 mg  5 mg Oral QPM Jomarie Longs, MD   5 mg at 10/19/13 1828  . magnesium hydroxide (MILK OF MAGNESIA) suspension 30 mL  30 mL Oral Daily PRN Nanine Means, NP      . OLANZapine zydis (ZYPREXA) disintegrating tablet 5 mg  5 mg Oral Q8H  PRN Beau Fanny, FNP       Or  . OLANZapine (ZYPREXA) injection 5 mg  5 mg Intramuscular Q8H PRN Beau Fanny, FNP      . ondansetron (ZOFRAN-ODT) disintegrating tablet 4 mg  4 mg Oral Q8H PRN Jomarie Longs, MD      . traZODone (DESYREL) tablet 50 mg  50 mg Oral QHS PRN Jomarie Longs, MD        Lab Results:  No results found for this or any previous visit (from the past 48 hour(s)).  Physical Findings: AIMS: Facial and Oral Movements Muscles of Facial Expression: None, normal Lips and Perioral Area: None, normal Jaw: None, normal Tongue: None, normal,Extremity Movements Upper (arms, wrists, hands, fingers): None, normal Lower (legs, knees, ankles, toes): None, normal, Trunk Movements Neck, shoulders, hips: None, normal, Overall Severity Severity of abnormal movements (highest score from questions above): None, normal Incapacitation due to abnormal movements: None, normal Patient's awareness of abnormal movements (rate only patient's report): No Awareness, Dental Status Current problems with teeth and/or dentures?: No Does patient usually wear dentures?: No  CIWA:    COWS:     Treatment Plan Summary: Daily contact with patient to assess and evaluate symptoms and progress in treatment Medication management  Plan: 1. Continue admission for treatment   2. Will hold her psychotropic medications for today and will reassess tomorrow. Patient is not willing to take any medications .     Will continue  Haldol  5 mg po qpm. Will continue Wellbutrin XL 150 mg for lack of motivation and energy issues.       Will provide prn's for anxiety ,sleep as well as agitation. 3. Develop treatment plan to decrease risk of relapse upon discharge of depressive symptoms and the need for readmission.  5. Group therapy to facilitate development of healthy coping skills to use for depression and anxiety.  6. Health care follow up as needed for medical problems.  7. Discharge plan to include therapy  to help patient cope with stressors.  8. Call for Consult with Hospitalist for additional specialty patient services as needed. 9. Labs reviewed -WNL. (10/17/13 -TSH,FreeT4,Vitamin B12)  Medical Decision Making Problem Points:  Established problem, worsening (2), Review of last therapy session (1) and Review of psycho-social stressors (1) Data Points:  Review or order clinical lab tests (1) Review or order medicine tests (1) Review and summation of old records (2) Review of medication regiment & side effects (2) Review of new medications or change in dosage (2)  I certify that inpatient services furnished can reasonably be expected to improve the patient's condition.   Romeka Scifres,Saramma9/17/2015, 1:26 PM MD

## 2013-10-20 NOTE — [facility] Group Notes (Signed)
[facility] LCSW Group Therapy  10/20/2013 3:04 PM  Type of Therapy:  Group Therapy  Participation Level:  Minimal  Participation Quality:  Attentive  Affect:  Anxious  Cognitive:  Lacking  Insight:  Limited  Engagement in Therapy:  Limited  Modes of Intervention:  Discussion, Education, Socialization and Support  Summary of Progress/Problems: Finding Balance in Life. Today's group focused on defining balance in one's own words, identifying things that can knock one off balance, and exploring healthy ways to maintain balance in life. Group members were asked to provide an example of a time when they felt off balance, describe how they handled that situation, and process healthier ways to regain balance in the future. Group members were asked to share the most important tool for maintaining balance that they learned while at [facility] and how they plan to apply this method after discharge. Pt identified a friend and therapist as her support system. She could not identify a place or activity that helps her find peace and tranquility. She has limited understanding of her diagnosis, and is set against taking medications "because of the way they make me feel."  However, she realizes her symptoms are preventing her from obtaining the goals she has set for herself.     King,Debbie 10/20/2013, 3:04 PM

## 2013-10-20 NOTE — Tx Team (Signed)
  Interdisciplinary Treatment Plan Update   Date Reviewed:  10/20/2013  Time Reviewed:  11:27 AM  Progress in Treatment:   Attending groups: Irregularly Participating in groups: Minimally Taking medication as prescribed: Yes  Tolerating medication: Yes Family/Significant other contact made: Yes  Patient understands diagnosis: Yes  Discussing patient identified problems/goals with staff: Yes  See initial care plan Medical problems stabilized or resolved: Yes Denies suicidal/homicidal ideation: Yes  In tx team Patient has not harmed self or others: Yes  For review of initial/current patient goals, please see plan of care.  Estimated Length of Stay:  3-5 days  Reason for Continuation of Hospitalization: Depression Hallucinations Medication stabilization  New Problems/Goals identified:  N/A  Discharge Plan or Barriers:   return home with mother, follow up outpt  Additional Comments:  She continues to be depressed ,anxious , and withdrawn. Reports feeling tired on her current medications. Reports Remeron is making her too tired and reports not being able to get out of bed today. Reports sleep as good as well as appetite as fair . Reports that she continues to hear voices ,but unable to elaborate them. Reports she is trying not to pay attention to voices.  Per staff patient has been refusing her medications ,since she feels they make her tired.  Patient educated about taking her medications for her symptoms for long term .  Spoke to mother who reports -patient had been talking in several voices as well as hearing voices since the past 2 months or so. Patient also had been talking in atleast 2-3 different kind of voices at [employer] when she was brought in to hospital.  Mother reports she has bipolar do and is worried that her daughter may have schizophrenia or bipolar.   Attendees:  Signature: Jomarie Longs, MD 10/20/2013 11:27 AM   Signature: Richelle Ito, LCSW 10/20/2013 11:27 AM   Signature:  10/20/2013 11:27 AM  Signature: Marzetta Board, RN 10/20/2013 11:27 AM  Signature:  10/20/2013 11:27 AM  Signature:  10/20/2013 11:27 AM  Signature:   10/20/2013 11:27 AM  Signature:    Signature:    Signature:    Signature:    Signature:    Signature:      Scribe for Treatment Team:   Richelle Ito, LCSW  10/20/2013 11:27 AM

## 2013-10-20 NOTE — [facility] Group Notes (Signed)
Adult Psychoeducational Group Note  Date:  10/20/2013 Time:  11:06 AM  Group Topic/Focus:  Self Care:   The focus of this group is to help patients understand the importance of self-care in order to improve or restore emotional, physical, spiritual, interpersonal, and financial health.  Participation Level:  Did Not Attend  Debbie King 10/20/2013, 11:06 AM

## 2013-10-21 MED ORDER — BENZTROPINE MESYLATE 1 MG/ML IJ SOLN
0.5000 mg | Freq: Every day | INTRAMUSCULAR | Status: DC
  Filled 2013-10-21 (×6): qty 0.5

## 2013-10-21 MED ORDER — LAMOTRIGINE 25 MG PO TABS: 25.0000 mg | ORAL_TABLET | Freq: Every day | ORAL | Status: DC

## 2013-10-21 MED ORDER — BENZTROPINE MESYLATE 0.5 MG PO TABS
0.5000 mg | ORAL_TABLET | Freq: Every day | ORAL | Status: DC
  Filled 2013-10-21 (×2): qty 1

## 2013-10-21 MED ORDER — OLANZAPINE 5 MG PO TBDP
2.5000 mg | ORAL_TABLET | Freq: Every day | ORAL | Status: DC
  Administered 2013-10-21: 2.5 mg via ORAL
  Filled 2013-10-21 (×2): qty 0.5

## 2013-10-21 MED ORDER — LAMOTRIGINE 25 MG PO TABS
25.0000 mg | ORAL_TABLET | Freq: Every evening | ORAL | Status: DC
  Administered 2013-10-21: 25 mg via ORAL
  Filled 2013-10-21 (×3): qty 1

## 2013-10-21 MED ORDER — OLANZAPINE 5 MG PO TBDP
2.5000 mg | ORAL_TABLET | Freq: Every day | ORAL | Status: DC
  Filled 2013-10-21 (×2): qty 0.5

## 2013-10-21 MED ORDER — OLANZAPINE 10 MG IM SOLR
2.5000 mg | Freq: Every day | INTRAMUSCULAR | Status: DC
  Filled 2013-10-21 (×2): qty 10

## 2013-10-21 MED ORDER — BENZTROPINE MESYLATE 0.5 MG PO TABS
0.5000 mg | ORAL_TABLET | Freq: Every day | ORAL | Status: DC
  Administered 2013-10-21 – 2013-10-25 (×5): 0.5 mg via ORAL
  Filled 2013-10-21: qty 14
  Filled 2013-10-21 (×6): qty 1

## 2013-10-21 NOTE — Progress Notes (Signed)
D: Patient's affect blunted and mood is depressed. She reported on the self inventory sheet that sleep and appetite are both fair, energy level is normal and ability to concentrate is poor. Patient rates depression/feelings of hopelessness "3" and anxiety "4". Her goal today is to work on coping with anxiety. Patient has been non-compliant with medications.  A: Support and encouragement provided to patient. Attempted to administer medications to pt.; she refused several times. Monitor Q15 minute checks for safety.  R: Patient receptive. Denies SI/HI and AVH. Patient remains safe on the unit.

## 2013-10-21 NOTE — [facility] Group Notes (Signed)
[facility] LCSW Aftercare Discharge Planning Group Note   10/21/2013 9:48 AM  Participation Quality:  Minimal  Mood/Affect:  Flat  Depression Rating:  3  Anxiety Rating:  4  Thoughts of Suicide:  No Will you contract for safety?   NA  Current AVH:  Denies  Plan for Discharge/Comments:  Pt plans to return home. She states that she does not want to take her medication because it is not helpful, and went on to list complaints associated with taking meds.  "I want to sign a 72 hour request for discharge because all you do for people here are push meds."     Transportation Means: Mother  Supports: Friend, Mother  Debbie King

## 2013-10-21 NOTE — [facility] Group Notes (Signed)
[facility] LCSW Group Therapy  10/21/2013 2:17 PM  Type of Therapy:  Group Therapy  Participation Level: Invited.  Did Not Attend  Hyatt,Kayelyn Lemon 10/21/2013, 2:17 PM

## 2013-10-21 NOTE — Progress Notes (Addendum)
[facility] MD Progress Note  10/21/2013 12:19 PM Debbie King  MRN:  [id number] Subjective:  Debbie King is a 18 year old female who was brought in to [facility] by a stranger after being found walking on [address]. At that time the patient expressed suicidal ideations. Patient has not been cooperative with treatment plan during her stay here. Patient would agree to take a medication and then the next day would refuse it. Patient reports that she is sensitive to medications and does not want to take them.  Objective: Pt seen and chart reviewed. Patient today refused to take any medications and asked for discharge. Patient per staff had a melt down yesterday when she was suicidal and was very anxious requiring prns. Patient also was talking in a different voice ,had a her tongue rolled up inside her mouth and reported she has side effects to medications she took yesterday ,eventhough she had not had any medications prior to her episode. She continues to be depressed ,anxious , and withdrawn and psychotic. Patient reported hearing AH yesterday and reported them as conversing with each other and to her ,but today she denies it. Patient also felt that her thoughts were being controlled by some one outside.     Per staff patient has been refusing her medications ,and appears to be very anxious.  Patient will be IVC ed since she meets commitment criteria and is a danger to self if discharged. Patient will be started on forced medication order if patient continues to refuse medications .    Diagnosis:   DSM5: Primary Psychiatric diagnosis: Schizoaffective disorder, depressive type, multiple episodes ,currently in acute episode  Secondary psychiatric diagnosis: Non compliance with treatment        Total Time spent with patient: 30 minutes  ADL's:  Intact  Sleep: Good  Appetite:  Good   Psychiatric Specialty Exam: Physical Exam  Constitutional: She is oriented to person, place, and time.  She appears well-developed.  Musculoskeletal: Normal range of motion.  Neurological: She is alert and oriented to person, place, and time.  Skin: Skin is warm.  Moderate acne  Psychiatric: She has a normal mood and affect. Her behavior is normal.    Review of Systems  Constitutional: Positive for malaise/fatigue.  Psychiatric/Behavioral: Positive for depression and hallucinations. Negative for suicidal ideas, memory loss and substance abuse. The patient is nervous/anxious.   All other systems reviewed and are negative.   Blood pressure 94/58, pulse 92, temperature 98.5 F (36.9 C), temperature source Oral, resp. rate 16, height  (1.6 m), weight 64.411 kg (142 lb), last menstrual period 10/12/2013.Body mass index is 25.16 kg/(m^2).  General Appearance: Fairly Groomed  Patent attorney::  Minimal  Speech:  Slow  Volume:  Normal  Mood:  Anxious and Depressed, withdrawn  Affect:  Depressed and Flat  Thought Process:  Circumstantial  Orientation:  Full (Time, Place, and Person)  Thought Content:  Hallucinations: Auditory   Suicidal Thoughts:  No was suicidal yesterday evening ,reported feelings of she will die and not wake up  Homicidal Thoughts:  No  Memory:  Immediate;   Good Recent;   Fair Remote;   Fair  Judgement:  Intact  Insight:  Fair  Psychomotor Activity:  Normal  Concentration:  Fair  Recall:  [employer] of Knowledge:Fair  Language: Good  Akathisia:  No  Handed:  Right  AIMS (if indicated):     Assets:  Communication Skills Housing Physical Health  Sleep:  Number of Hours:  5.5   Musculoskeletal: Strength & Muscle Tone: within normal limits Gait & Station: normal Patient leans: N/A  Current Medications: Current Facility-Administered Medications  Medication Dose Route Frequency Provider Last Rate Last Dose  . acetaminophen (TYLENOL) tablet 650 mg  650 mg Oral Q6H PRN Nanine Means, NP      . alum & mag hydroxide-simeth (MAALOX/MYLANTA) 200-200-20 MG/5ML  suspension 30 mL  30 mL Oral Q4H PRN Nanine Means, NP      . benztropine (COGENTIN) tablet 0.5 mg  0.5 mg Oral QHS Kortlyn Koltz, MD      . diphenhydrAMINE (BENADRYL) capsule 25 mg  25 mg Oral Q6H PRN Jomarie Longs, MD       Or  . diphenhydrAMINE (BENADRYL) injection 25 mg  25 mg Intramuscular Q6H PRN Jomarie Longs, MD   25 mg at 10/20/13 1857  . lamoTRIgine (LAMICTAL) tablet 25 mg  25 mg Oral QPM Edahi Kroening, MD      . LORazepam (ATIVAN) tablet 0.5 mg  0.5 mg Oral Q6H PRN Irania Durell, MD      . magnesium hydroxide (MILK OF MAGNESIA) suspension 30 mL  30 mL Oral Daily PRN Nanine Means, NP      . menthol-cetylpyridinium (CEPACOL) lozenge 3 mg  1 lozenge Oral Daily PRN Jomarie Longs, MD   3 mg at 10/20/13 1552  . OLANZapine zydis (ZYPREXA) disintegrating tablet 5 mg  5 mg Oral Q8H PRN Beau Fanny, FNP       Or  . OLANZapine (ZYPREXA) injection 5 mg  5 mg Intramuscular Q8H PRN Beau Fanny, FNP      . OLANZapine zydis (ZYPREXA) disintegrating tablet 2.5 mg  2.5 mg Oral QHS [place], MD      . ondansetron (ZOFRAN-ODT) disintegrating tablet 4 mg  4 mg Oral Q8H PRN Jamacia Jester, MD      . traZODone (DESYREL) tablet 50 mg  50 mg Oral QHS PRN Jomarie Longs, MD        Lab Results:  No results found for this or any previous visit (from the past 48 hour(s)).  Physical Findings: AIMS: Facial and Oral Movements Muscles of Facial Expression: None, normal Lips and Perioral Area: None, normal Jaw: None, normal Tongue: None, normal,Extremity Movements Upper (arms, wrists, hands, fingers): None, normal Lower (legs, knees, ankles, toes): None, normal, Trunk Movements Neck, shoulders, hips: None, normal, Overall Severity Severity of abnormal movements (highest score from questions above): None, normal Incapacitation due to abnormal movements: None, normal Patient's awareness of abnormal movements (rate only patient's report): No Awareness, Dental Status Current problems with teeth  and/or dentures?: No Does patient usually wear dentures?: No  CIWA:    COWS:     Treatment Plan Summary: Daily contact with patient to assess and evaluate symptoms and progress in treatment Medication management  Plan: 1. Continue admission for treatment   2. Will IVC patient. Will DC Haldol for possible EPS -she had an episode yesterday (see above) . Will start a trial of Zyprexa 2.5 mg po qhs ,since patient is sensitive to medication, will start low dose.      Plan to start patient on forced medication order since patient has been refusing medications . Forced medication order in chart.      Will start Lamictal 25 mg po daily. Could increase as needed. Patient reports racing thoughts and anxiety.     3. Develop treatment plan to decrease risk of relapse upon discharge of depressive symptoms and the need for readmission.  5. Group therapy to facilitate development of healthy coping skills to use for depression and anxiety.  6. Health care follow up as needed for medical problems.  7. Discharge plan to include therapy to help patient cope with stressors.  8. Call for Consult with Hospitalist for additional specialty patient services as needed. 9. Labs reviewed -WNL. (10/17/13 -TSH,FreeT4,Vitamin B12)  Medical Decision Making Problem Points:  Established problem, worsening (2), Review of last therapy session (1) and Review of psycho-social stressors (1) Data Points:  Review or order clinical lab tests (1) Review or order medicine tests (1) Review and summation of old records (2) Review of medication regiment & side effects (2) Review of new medications or change in dosage (2)  I certify that inpatient services furnished can reasonably be expected to improve the patient's condition.   Verlie Hellenbrand,Saramma9/18/2015, 12:19 PM MD

## 2013-10-21 NOTE — Progress Notes (Signed)
Patient ID: Debbie King, female   DOB: Dec 15, 1995, 18 y.o.   MRN: [id number] D: Patient has been refusing her medication stating it making her symptoms worst. Pt reported she discussed issues with MD. Pt has been calm this evening interacting with roommate.  Pt denies auditory and visual hallucination. Pt endorses passive suicidal ideation and contract for safety. No acute distressed noted at this time.   A: Met with pt 1:1.  Writer encouraged pt to discuss feelings. Pt encouraged to come to staff with any questions or concerns.   R: Patient is safe on the unit. Pt is able to discuss thoughts with staff.

## 2013-10-21 NOTE — Progress Notes (Signed)
[facility] Group Notes:  (Nursing/MHT/Case Management/Adjunct)  Date:  10/21/2013  Time:  9:42 PM  Type of Therapy:  Group Therapy  Participation Level:  Minimal  Participation Quality:  Appropriate  Affect:  Appropriate  Cognitive:  Appropriate  Insight:  Appropriate  Engagement in Group:  Developing/Improving  Modes of Intervention:  Socialization and Support  Summary of Progress/Problems: Pt. Stated her energy level was 7.  Pt. Stated listening to music was a coping skill she use for relapse prevention.  Sondra Come 10/21/2013, 9:42 PM

## 2013-10-22 DIAGNOSIS — F329 Major depressive disorder, single episode, unspecified: Secondary | ICD-10-CM

## 2013-10-22 DIAGNOSIS — F3289 Other specified depressive episodes: Secondary | ICD-10-CM

## 2013-10-22 DIAGNOSIS — Z9119 Patient's noncompliance with other medical treatment and regimen: Secondary | ICD-10-CM

## 2013-10-22 DIAGNOSIS — Z91199 Patient's noncompliance with other medical treatment and regimen due to unspecified reason: Secondary | ICD-10-CM

## 2013-10-22 DIAGNOSIS — F259 Schizoaffective disorder, unspecified: Secondary | ICD-10-CM

## 2013-10-22 MED ORDER — OLANZAPINE 5 MG PO TBDP
5.0000 mg | ORAL_TABLET | Freq: Every day | ORAL | Status: DC
  Administered 2013-10-22 – 2013-10-23 (×2): 5 mg via ORAL
  Filled 2013-10-22 (×3): qty 1

## 2013-10-22 MED ORDER — OLANZAPINE 10 MG IM SOLR
5.0000 mg | Freq: Every day | INTRAMUSCULAR | Status: DC
  Filled 2013-10-22 (×3): qty 10

## 2013-10-22 MED ORDER — LAMOTRIGINE 25 MG PO TABS
25.0000 mg | ORAL_TABLET | Freq: Two times a day (BID) | ORAL | Status: DC
  Administered 2013-10-22 – 2013-10-26 (×9): 25 mg via ORAL
  Filled 2013-10-22 (×5): qty 1
  Filled 2013-10-22: qty 28
  Filled 2013-10-22 (×3): qty 1
  Filled 2013-10-22: qty 28
  Filled 2013-10-22 (×3): qty 1

## 2013-10-22 MED ORDER — LAMOTRIGINE 25 MG PO TABS
ORAL_TABLET | ORAL | Status: AC
  Filled 2013-10-22: qty 1

## 2013-10-22 NOTE — Progress Notes (Signed)
[facility] Group Notes:  (Nursing/MHT/Case Management/Adjunct)  Date:  10/22/2013  Time:  9:19 PM  Type of Therapy:  Psychoeducational Skills  Participation Level:  Active  Participation Quality:  Attentive  Affect:  Flat  Cognitive:  Appropriate  Insight:  Appropriate  Engagement in Group:  Improving  Modes of Intervention:  Education  Summary of Progress/Problems: The patient shared in group that she had a good day overall. She states that she spent some time playing cards with her peers, but at the same time, she complained that the medication was making her drowsy. In terms of the theme for the day, her coping skill is to listen to music.   Hazle Coca S 10/22/2013, 9:19 PM

## 2013-10-22 NOTE — Progress Notes (Signed)
D Pt. Denies SI and HI, no complaints of pain or discomfort noted at this time, denies A and VH  A Writer offered support and encouragement.  Discussed medications with pt.  R  Pt. Remains safe on the unit,  Pt. Does not want to take her scheduled zyprexa  Stating she has already had it today, Writer went through the patients medications with her and explained why she took the zyprexa earlier today. Pt. Did eventually take the  Medication with no issues.

## 2013-10-22 NOTE — [facility] Group Notes (Signed)
[facility] Group Notes:  (Clinical Social Work)  10/22/2013  11:15-12:00PM  Summary of Progress/Problems:   The main focus of today's process group was to discuss patients' feelings about hospitalization, the stigma attached to mental health, and sources of motivation to stay well.  We then worked to identify a specific plan to avoid future hospitalizations when discharged from the hospital for this admission.  The patient expressed at the beginning of group that she feels positive about being in the hospital, that her medications appear to be working to help with her anxiety and depression.  She stated that she feels labeled by being in the hospital, and used as an example that her father has told other people about her hospitalization, and she does not feel he should have done that.  As the group discussed ways to handle that, she did not respond to any suggestions but rather curled up on her seat and appeared to go to sleep.  Type of Therapy:  Group Therapy - Process  Participation Level:  Minimal  Participation Quality:  Attentive and Drowsy  Affect:  Flat  Cognitive:  Appropriate and Oriented  Insight:  Improving  Engagement in Therapy:  Improving  Modes of Intervention:  Exploration, Discussion  [name], LCSW 10/22/2013, 1:27 PM

## 2013-10-22 NOTE — Progress Notes (Signed)
Patient ID: Debbie King, female   DOB: 1995-06-08, 18 y.o.   MRN: [id number]   D: Pt has been very flat and depressed, and very disorganized on the unit. Pt started off the morning refusing to take her medications, she reported that she only took medication at 5pm. This writer advised patient of the doctors new orders and encouraged her to take her medications as prescribed by the doctor. Pt was very reluctant, however took all medications without any problems. After taking the medication, the patient went to bed and was able to sleep. When patient woke up she reported that that she felt much better, and that she was going to do what she was suppose to do so that she could be discharged on Tuesday.  Pt reported being negative SI/HI, no AH/VH noted. A: 15 min checks continued for patient safety. R: Pt safety maintained.

## 2013-10-22 NOTE — Progress Notes (Signed)
Patient ID: Debbie King, female   DOB: 1995-07-31, 18 y.o.   MRN: [id number]  [facility] MD Progress Note  Subjective:  I don't need any medication.  I don't have any psychiatric illness.   I have anxiety.  Objective: Pt seen and chart reviewed.  Patient continued to refuse taking medication.  She remains very irritable , guarded and labile.  Today she mentioned that she has never been suicidal and staff as lying about her.  However she admitted having insomnia, anxiety and she took Zyprexa 2.5 mg at bedtime after excessive encouragement.  She appears guarded and withdrawn.  She admitted that she is hitting her own thinking.  Her thought process remains very circumstantial .  Her speech is rambling and incoherent sometimes.  She had participated in some groups after some encouragement.  The patient was placed on IV C. yesterday because she reported suicidal thoughts and auditory hallucination.  She is also refusing her medication.  Diagnosis:   DSM5: Primary Psychiatric diagnosis: Schizoaffective disorder, depressive type, multiple episodes ,currently in acute episode  Secondary psychiatric diagnosis: Non compliance with treatment        Total Time spent with patient: 20 minutes  ADL's:  Intact  Sleep: Good  Appetite:  Good   Psychiatric Specialty Exam: Physical Exam  Constitutional: She is oriented to person, place, and time. She appears well-developed.  Musculoskeletal: Normal range of motion.  Neurological: She is alert and oriented to person, place, and time.  Skin: Skin is warm.  Moderate acne  Psychiatric: She has a normal mood and affect. Her behavior is normal.    Review of Systems  Constitutional: Positive for malaise/fatigue.  Psychiatric/Behavioral: Positive for depression and hallucinations. Negative for suicidal ideas, memory loss and substance abuse. The patient is nervous/anxious.   All other systems reviewed and are negative.   Blood pressure 107/62, pulse 108,  temperature 97.6 F (36.4 C), temperature source Oral, resp. rate 16, height  (1.6 m), weight 142 lb (64.411 kg), last menstrual period 10/12/2013.Body mass index is 25.16 kg/(m^2).  General Appearance: Fairly Groomed  Patent attorney::  Minimal  Speech:  Slow and Rambling and incoherent  Volume:  Normal  Mood:  Anxious and Depressed, withdrawn  Affect:  Depressed and Flat  Thought Process:  Circumstantial  Orientation:  Full (Time, Place, and Person)  Thought Content:  Hallucinations: Auditory   Suicidal Thoughts:  No was suicidal yesterday evening ,reported feelings of she will die and not wake up  Homicidal Thoughts:  No  Memory:  Immediate;   Fair Recent;   Fair Remote;   Fair  Judgement:  Impaired  Insight:  Lacking  Psychomotor Activity:  Normal  Concentration:  Fair  Recall:  [employer] of Knowledge:Fair  Language: Good  Akathisia:  No  Handed:  Right  AIMS (if indicated):     Assets:  Communication Skills Housing Physical Health  Sleep:  Number of Hours: 5.5   Musculoskeletal: Strength & Muscle Tone: within normal limits Gait & Station: normal Patient leans: N/A  Current Medications: Current Facility-Administered Medications  Medication Dose Route Frequency Provider Last Rate Last Dose  . acetaminophen (TYLENOL) tablet 650 mg  650 mg Oral Q6H PRN Nanine Means, NP      . alum & mag hydroxide-simeth (MAALOX/MYLANTA) 200-200-20 MG/5ML suspension 30 mL  30 mL Oral Q4H PRN Nanine Means, NP      . benztropine (COGENTIN) tablet 0.5 mg  0.5 mg Oral QHS Jomarie Longs, MD   0.5  mg at 10/21/13 2131   Or  . benztropine mesylate (COGENTIN) injection 0.5 mg  0.5 mg Intramuscular QHS Saramma Eappen, MD      . diphenhydrAMINE (BENADRYL) capsule 25 mg  25 mg Oral Q6H PRN Jomarie Longs, MD       Or  . diphenhydrAMINE (BENADRYL) injection 25 mg  25 mg Intramuscular Q6H PRN Jomarie Longs, MD   25 mg at 10/20/13 1857  . lamoTRIgine (LAMICTAL) tablet 25 mg  25 mg Oral BID Cleotis Nipper, MD      . LORazepam (ATIVAN) tablet 0.5 mg  0.5 mg Oral Q6H PRN Saramma Eappen, MD      . magnesium hydroxide (MILK OF MAGNESIA) suspension 30 mL  30 mL Oral Daily PRN Nanine Means, NP      . menthol-cetylpyridinium (CEPACOL) lozenge 3 mg  1 lozenge Oral Daily PRN Jomarie Longs, MD   3 mg at 10/20/13 1552  . OLANZapine zydis (ZYPREXA) disintegrating tablet 5 mg  5 mg Oral Q8H PRN Beau Fanny, FNP       Or  . OLANZapine (ZYPREXA) injection 5 mg  5 mg Intramuscular Q8H PRN Beau Fanny, FNP      . OLANZapine zydis (ZYPREXA) disintegrating tablet 5 mg  5 mg Oral QHS Cleotis Nipper, MD       Or  . OLANZapine (ZYPREXA) injection 5 mg  5 mg Intramuscular QHS Cleotis Nipper, MD      . ondansetron (ZOFRAN-ODT) disintegrating tablet 4 mg  4 mg Oral Q8H PRN Jomarie Longs, MD      . traZODone (DESYREL) tablet 50 mg  50 mg Oral QHS PRN Jomarie Longs, MD        Lab Results:  No results found for this or any previous visit (from the past 48 hour(s)).  Physical Findings: AIMS: Facial and Oral Movements Muscles of Facial Expression: None, normal Lips and Perioral Area: None, normal Jaw: None, normal Tongue: None, normal,Extremity Movements Upper (arms, wrists, hands, fingers): None, normal Lower (legs, knees, ankles, toes): None, normal, Trunk Movements Neck, shoulders, hips: None, normal, Overall Severity Severity of abnormal movements (highest score from questions above): None, normal Incapacitation due to abnormal movements: None, normal Patient's awareness of abnormal movements (rate only patient's report): No Awareness, Dental Status Current problems with teeth and/or dentures?: No Does patient usually wear dentures?: No  CIWA:    COWS:     Treatment Plan Summary: Daily contact with patient to assess and evaluate symptoms and progress in treatment Medication management  Plan: 1.The patient remains very labile, irritable and depressed.  She was given Zyprexa 2.5 mg at bedtime  and Lamictal 25 mg.  Patient do not report any side effects of medication. 2. Continue admission for treatment  patient does not have any rash, itching or any tremors.  Patient remains inpatient psychiatric treatment for stabilization.  Increase Zyprexa 5 mg at bedtime and Lamictal 25 mg twice a day.  This patient refusing medication then she can be given forced medication. Forced medication order in chart.    3. Develop treatment plan to decrease risk of relapse upon discharge of depressive symptoms and the need for readmission.  5. Group therapy to facilitate development of healthy coping skills to use for depression and anxiety.  6. Health care follow up as needed for medical problems.  7. Discharge plan to include therapy to help patient cope with stressors.  8. Call for Consult with Hospitalist for additional specialty patient services as  needed. 9. Labs reviewed -WNL. (10/17/13 -TSH,FreeT4,Vitamin B12)  Medical Decision Making Problem Points:  Established problem, worsening (2), Review of last therapy session (1) and Review of psycho-social stressors (1) Data Points:  Review or order clinical lab tests (1) Review or order medicine tests (1) Review and summation of old records (2) Review of medication regiment & side effects (2) Review of new medications or change in dosage (2)  I certify that inpatient services furnished can reasonably be expected to improve the patient's condition.   [place] Cinqu[place]ani T.10/22/2013, 9:53 AM MD

## 2013-10-22 NOTE — [facility] Group Notes (Signed)
[facility] Group Notes:  (Nursing/MHT/Case Management/Adjunct)  Date:  10/22/2013  Time:  1:14 PM  Type of Therapy:  Psychoeducational Skills- Healthy Coping Skills Group  Participation Level:  Minimal  Participation Quality:  Resistant  Affect:  Flat  Cognitive:  Disorganized  Insight:  Lacking  Engagement in Group:  Lacking  Modes of Intervention:  Problem-solving  Summary of Progress/Problems: Pt attended patient healthy coping skills group. Keeshia Sanderlin Shanta 10/22/2013, 1:14 PM 

## 2013-10-22 NOTE — [facility] Group Notes (Signed)
[facility] Group Notes:  (Nursing/MHT/Case Management/Adjunct)  Date:  10/22/2013  Time:  1:12 PM  Type of Therapy:  Psychoeducational Skills- Patient Self Inventory Group  Participation Level:  Minimal  Participation Quality:  Resistant  Affect:  Flat  Cognitive:  Disorganized  Insight:  Lacking  Engagement in Group:  Lacking  Modes of Intervention:  Problem-solving  Summary of Progress/Problems: Pt attended patient self inventory group.  Debbie King 10/22/2013, 1:12 PM 

## 2013-10-23 NOTE — [facility] Group Notes (Signed)
[facility] Group Notes:  (Clinical Social Work)  10/23/2013   11:15am-12:00pm  Summary of Progress/Problems:  The main focus of today's process group was to listen to a variety of genres of music and to identify that different types of music provoke different responses.  The patient then was able to identify personally what was soothing for them, as well as energizing.  Handouts were used to record feelings evoked, as well as how patient can personally use this knowledge in sleep habits, with depression, and with other symptoms.  The patient expressed understanding of concepts, as well as knowledge of how each type of music affected her and how this can be used at home as a wellness/recovery tool.  She was irritable at times, but self-calmed.  She left the room for awhile, did return for the last part of group.  Type of Therapy:  Music Therapy   Participation Level:  Active  Participation Quality:  Attentive and Sharing  Affect:  Flat  Cognitive:  Oriented  Insight:  Engaged  Engagement in Therapy:  Engaged  Modes of Intervention:   Activity, Exploration  [name], LCSW 10/23/2013, 12:30pm

## 2013-10-23 NOTE — Progress Notes (Signed)
Patient ID: Debbie King, female   DOB: 1995-02-24, 18 y.o.   MRN: [id number] Patient ID: Debbie King, female   DOB: 01-29-1996, 18 y.o.   MRN: [id number]  [facility] MD Progress Note  Subjective: Debbie King reports, "I'm sleeping a lot. My mood is fine".   Objective: Pt seen and chart reviewed. Debbie King says, she stopped her birth control pills abruptly and that made her to have racing thoughts and she was talking to herself. She blamed it on school related stressors. She says she is scheduled to see a neurologist in 2 weeks to see about her health. She asked for her anxiety medication that was scheduled bid to be given to her at once. She wants to call the [facility] to tell them that she does not want to drop out of college, but worried that her mother may have signed papers to drop her out already. She says she does not live with mother and that her mother should not be the one to make such decisions for her. Debbie King remains stressed and worried. She is participating in groups.   Diagnosis:   DSM5: Primary Psychiatric diagnosis: Schizoaffective disorder, depressive type, multiple episodes ,currently in acute episode  Secondary psychiatric diagnosis: Non compliance with treatment  ADL's:  Intact  Sleep: Good  Appetite:  Good   Psychiatric Specialty Exam: Physical Exam  Constitutional: She is oriented to person, place, and time. She appears well-developed.  Musculoskeletal: Normal range of motion.  Neurological: She is alert and oriented to person, place, and time.  Skin: Skin is warm.  Moderate acne  Psychiatric: She has a normal mood and affect. Her behavior is normal.    Review of Systems  Constitutional: Positive for malaise/fatigue.  Psychiatric/Behavioral: Positive for depression and hallucinations. Negative for suicidal ideas, memory loss and substance abuse. The patient is nervous/anxious.   All other systems reviewed and are negative.   Blood pressure 111/60, pulse 73,  temperature 97.8 F (36.6 C), temperature source Oral, resp. rate 18, height  (1.6 m), weight 64.411 kg (142 lb), last menstrual period 10/12/2013.Body mass index is 25.16 kg/(m^2).  General Appearance: Fairly Groomed  Patent attorney::  Minimal  Speech:  Clear and Coherent and Normal Rate  Volume:  Normal  Mood:  Anxious and Depressed, withdrawn  Affect:  Depressed and Flat  Thought Process:  Circumstantial  Orientation:  Full (Time, Place, and Person)  Thought Content:  Hallucinations: denies  Suicidal Thoughts:  No  Homicidal Thoughts:  No  Memory:  Immediate;   Fair Recent;   Fair Remote;   Fair  Judgement:  Impaired  Insight:  Lacking  Psychomotor Activity:  Restlessness  Concentration:  Fair  Recall:  [employer] of Knowledge:Fair  Language: Good  Akathisia:  No  Handed:  Right  AIMS (if indicated):     Assets:  Communication Skills Housing Physical Health  Sleep:  Number of Hours: 6.75   Musculoskeletal: Strength & Muscle Tone: within normal limits Gait & Station: normal Patient leans: N/A  Current Medications: Current Facility-Administered Medications  Medication Dose Route Frequency Provider Last Rate Last Dose  . acetaminophen (TYLENOL) tablet 650 mg  650 mg Oral Q6H PRN Nanine Means, NP      . alum & mag hydroxide-simeth (MAALOX/MYLANTA) 200-200-20 MG/5ML suspension 30 mL  30 mL Oral Q4H PRN Nanine Means, NP      . benztropine (COGENTIN) tablet 0.5 mg  0.5 mg Oral QHS Jomarie Longs, MD   0.5 mg at 10/22/13  2112   Or  . benztropine mesylate (COGENTIN) injection 0.5 mg  0.5 mg Intramuscular QHS Saramma Eappen, MD      . diphenhydrAMINE (BENADRYL) capsule 25 mg  25 mg Oral Q6H PRN Jomarie Longs, MD       Or  . diphenhydrAMINE (BENADRYL) injection 25 mg  25 mg Intramuscular Q6H PRN Jomarie Longs, MD   25 mg at 10/20/13 1857  . lamoTRIgine (LAMICTAL) tablet 25 mg  25 mg Oral BID Cleotis Nipper, MD   25 mg at 10/23/13 0746  . LORazepam (ATIVAN) tablet 0.5 mg   0.5 mg Oral Q6H PRN Jomarie Longs, MD   0.5 mg at 10/22/13 1010  . magnesium hydroxide (MILK OF MAGNESIA) suspension 30 mL  30 mL Oral Daily PRN Nanine Means, NP      . menthol-cetylpyridinium (CEPACOL) lozenge 3 mg  1 lozenge Oral Daily PRN Jomarie Longs, MD   3 mg at 10/20/13 1552  . OLANZapine zydis (ZYPREXA) disintegrating tablet 5 mg  5 mg Oral Q8H PRN Beau Fanny, FNP   5 mg at 10/22/13 1009   Or  . OLANZapine (ZYPREXA) injection 5 mg  5 mg Intramuscular Q8H PRN Beau Fanny, FNP      . OLANZapine zydis (ZYPREXA) disintegrating tablet 5 mg  5 mg Oral QHS Cleotis Nipper, MD   5 mg at 10/22/13 2112   Or  . OLANZapine (ZYPREXA) injection 5 mg  5 mg Intramuscular QHS Cleotis Nipper, MD      . ondansetron (ZOFRAN-ODT) disintegrating tablet 4 mg  4 mg Oral Q8H PRN Jomarie Longs, MD      . traZODone (DESYREL) tablet 50 mg  50 mg Oral QHS PRN Jomarie Longs, MD        Lab Results:  No results found for this or any previous visit (from the past 48 hour(s)).  Physical Findings: AIMS: Facial and Oral Movements Muscles of Facial Expression: None, normal Lips and Perioral Area: None, normal Jaw: None, normal Tongue: None, normal,Extremity Movements Upper (arms, wrists, hands, fingers): None, normal Lower (legs, knees, ankles, toes): None, normal, Trunk Movements Neck, shoulders, hips: None, normal, Overall Severity Severity of abnormal movements (highest score from questions above): None, normal Incapacitation due to abnormal movements: None, normal Patient's awareness of abnormal movements (rate only patient's report): No Awareness, Dental Status Current problems with teeth and/or dentures?: No Does patient usually wear dentures?: No  CIWA:    COWS:     Treatment Plan Summary: Daily contact with patient to assess and evaluate symptoms and progress in treatment Medication management  Plan: 1.The patient remains very wooried, irritable, stressed and depressed, continue current  treatment plan. 2. Continue admission for treatment  patient does not have any rash, itching or any tremors. continue Zyprexa 5 mg at bedtime and Lamictal 25 mg twice a day.  This patient refusing medication then she can be given forced medication. Forced medication order in chart.    3. Develop treatment plan to decrease risk of relapse upon discharge of depressive symptoms and the need for readmission.  5. Group therapy to facilitate development of healthy coping skills to use for depression and anxiety.  6. Health care follow up as needed for medical problems.  7. Discharge plan to include therapy to help patient cope with stressors.  8. Call for Consult with Hospitalist for additional specialty patient services as needed. 9. Labs reviewed -WNL. (10/17/13 -TSH,FreeT4,Vitamin B12)  Medical Decision Making Problem Points:  Established problem, worsening (  2), Review of last therapy session (1) and Review of psycho-social stressors (1) Data Points:  Review or order clinical lab tests (1) Review or order medicine tests (1) Review and summation of old records (2) Review of medication regiment & side effects (2) Review of new medications or change in dosage (2)  I certify that inpatient services furnished can reasonably be expected to improve the patient's condition.   Armandina Stammer I9/20/2015, 5:02 PM, PMHNP.  I agreed with the findings, treatment and disposition plan of this patient. Kathryne Sharper, MD

## 2013-10-23 NOTE — Progress Notes (Signed)
D: Pt presents with anxious affect and mood.  Pt states "I just want to know when I'm leaving.  I've been here for 11 days because when I first came, I wouldn't take the medicine.  I've been going to all the groups and taking my medication and I feel ready to leave."  Pt denies SI and HI.  Pt denies hallucinations.   A: Encouraged and supported pt.  Safety maintained.  Medications administered per order. R: Pt attended evening group.  Pt was hesitant to take medications at HS.  Pt reported "I think I already took zyprexa twice today.  I don't know that I really need to take it again."  Educated pt on medication regimen and pt was compliant with HS medications.  Pt is in no distress.  Will continue to monitor and assess for safety.

## 2013-10-23 NOTE — [facility] Group Notes (Signed)
[facility] Group Notes:  (Nursing/MHT/Case Management/Adjunct)  Date:  10/23/2013  Time:  11:35 AM  Type of Therapy:  Psychoeducational Skills  Participation Level:  Active  Participation Quality:  Appropriate  Affect:  Appropriate  Cognitive:  Appropriate  Insight:  Appropriate  Engagement in Group:  Engaged  Modes of Intervention:  Discussion  Summary of Progress/Problems: Pt did attend self inventory group. Jacquelyne Balint Shanta 10/23/2013, 11:35 AM

## 2013-10-23 NOTE — Progress Notes (Signed)
[facility] Group Notes:  (Nursing/MHT/Case Management/Adjunct)  Date:  10/23/2013  Time:  11:55 PM  Type of Therapy:  Psychoeducational Skills  Participation Level:  Active  Participation Quality:  Appropriate  Affect:  Blunted  Cognitive:  Appropriate  Insight:  Improving  Engagement in Group:  Improving  Modes of Intervention:  Education and Exploration  Summary of Progress/Problems: The patient described her day as having been okay. She states that she was able to go outside for fresh air. The patient also mentioned that she took all of her medication and may be discharged tomorrow. In terms of the theme for the day, her support system is going to be her mother.   Hazle Coca S 10/23/2013, 11:55 PM

## 2013-10-23 NOTE — [facility] Group Notes (Signed)
[facility] Group Notes:  (Nursing/MHT/Case Management/Adjunct)  Date:  10/23/2013  Time:  11:36 AM  Type of Therapy:  Psychoeducational Skills  Participation Level:  Active  Participation Quality:  Appropriate  Affect:  Appropriate  Cognitive:  Appropriate  Insight:  Appropriate  Engagement in Group:  Engaged  Modes of Intervention:  Discussion  Summary of Progress/Problems: Pt did attend healthy support systems.  Debbie King 10/23/2013, 11:36 AM 

## 2013-10-23 NOTE — Progress Notes (Signed)
Patient ID: Debbie King, female   DOB: February 11, 1995, 18 y.o.   MRN: [id number]   D: Pt has been appropriate on the unit today, she has attended groups and engaged in treatment. Pt reported that she just wanted to work hard to get better so that she can be discharged, and go back to school. Pt has taken all medications without any problems. Pt reported being negative SI/HI, no AH/VH noted. A: 15 min checks continued for patient safety. R: Pt safety maintained.

## 2013-10-24 MED ORDER — OLANZAPINE 10 MG PO TBDP
10.0000 mg | ORAL_TABLET | Freq: Every day | ORAL | Status: DC
  Administered 2013-10-24 – 2013-10-25 (×2): 10 mg via ORAL
  Filled 2013-10-24 (×2): qty 1
  Filled 2013-10-24: qty 14
  Filled 2013-10-24: qty 1

## 2013-10-24 MED ORDER — OLANZAPINE 10 MG IM SOLR
10.0000 mg | Freq: Every day | INTRAMUSCULAR | Status: DC
  Filled 2013-10-24 (×3): qty 10

## 2013-10-24 NOTE — Progress Notes (Signed)
Adult Psychoeducational Group Note  Date:  10/24/2013 Time:  11:09 PM  Group Topic/Focus:  Wrap-Up Group:   The focus of this group is to help patients review their daily goal of treatment and discuss progress on daily workbooks.  Participation Level:  Minimal  Participation Quality:  Appropriate  Affect:  Flat  Cognitive:  Oriented  Insight: Limited  Engagement in Group:  Limited  Modes of Intervention:  Socialization and Support  Additional Comments:  Patient attended and participated in group tonight. She reports that she did not get up for breakfast but she attended her groups and went for her other meals. She has been refusing her medication since Friday, therefore she was given and injection today. She advised that her mother applied for SSI on her behalf.  Overall her day was great. She don't want to return to live with her parent she plans to find money for her to stay in a hotel.  Debbie King [place] 10/24/2013, 11:09 PM

## 2013-10-24 NOTE — Plan of Care (Signed)
Problem: Ineffective individual coping Goal: STG: Patient will remain free from self harm Outcome: Progressing Pt has not harmed self this shift.    Problem: Alteration in mood Goal: LTG-Patient reports reduction in suicidal thoughts (Patient reports reduction in suicidal thoughts and is able to verbalize a safety plan for whenever patient is feeling suicidal)  Outcome: Progressing Pt denied SI this shift.    Problem: Diagnosis: Increased Risk For Suicide Attempt Goal: STG-Patient Will Attend All Groups On The Unit Outcome: Progressing Pt attended evening group on 10/23/13.

## 2013-10-24 NOTE — [facility] Group Notes (Signed)
[facility] LCSW Group Therapy  10/24/2013 1:15 pm  Type of Therapy: Process Group Therapy  Participation Level:  Active  Participation Quality:  Appropriate  Affect:  Flat  Cognitive:  Oriented  Insight:  Improving  Engagement in Group:  Limited  Engagement in Therapy:  Limited  Modes of Intervention:  Activity, Clarification, Education, Problem-solving and Support  Summary of Progress/Problems: Today's group addressed the issue of overcoming obstacles.  Patients were asked to identify their biggest obstacle post d/c that stands in the way of their on-going success, and then problem solve as to how to manage this.  A past obstacle she overcame was graduating from high school.  Though that was not really the goal.  The goal is getting a doctorate.  But she acknowledged that graduation was an important smaller step towards that goal.  We came back to that theme several times with other patients as well.  She talked about a current obstacle she is working on overcoming which is to continue to take meds.  She stated she believes they are helping [unlike last week] and that to be successful in school and not become too anxious, she needs to continue to take them.  Daryel Gerald B 10/24/2013   4:18 PM

## 2013-10-24 NOTE — Progress Notes (Signed)
Patient ID: Debbie King, female   DOB: [birth year], 18 y.o.   MRN: [id number] Patient ID: Debbie King, female   DOB: December 22, 1995, 18 y.o.   MRN: [id number]  [facility] MD Progress Note  Subjective: Debbie King reports, " I feel better, my thoughts are better"   Objective: Pt seen and chart reviewed. Debbie King says, she is very happy that she came to the hospital and that she has started taking medications, since she feels much better and her thoughts are not racing as it used to before. Patient denies SI/HI/AH/VH. Patient is more calm and cooperative and is compliant with medications.  Discussed with patient that her mother was concerned about her and wanted her to get tested for STDs. 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 She reports she would follow up on an outpatient basis in the future if she has concerns.    Diagnosis:   DSM5: Primary Psychiatric diagnosis: Schizoaffective disorder, depressive type, multiple episodes ,currently in acute episode  Secondary psychiatric diagnosis: Non compliance with treatment  ADL's:  Intact  Sleep: Good  Appetite:  Good   Psychiatric Specialty Exam: Physical Exam  Constitutional: She is oriented to person, place, and time. She appears well-developed.  Musculoskeletal: Normal range of motion.  Neurological: She is alert and oriented to person, place, and time.  Skin: Skin is warm.  Moderate acne  Psychiatric: She has a normal mood and affect. Her behavior is normal.    Review of Systems  Psychiatric/Behavioral: Positive for depression (improving). Negative for suicidal ideas, hallucinations, memory loss and substance abuse. The patient is nervous/anxious (improving).   All other systems reviewed and are negative.   Blood pressure 107/68, pulse 95, temperature 98.2 F (36.8 C), temperature source Oral, resp. rate 17, height  (1.6 m), weight 64.411 kg (142 lb), last  menstrual period 10/12/2013.Body mass index is 25.16 kg/(m^2).  General Appearance: Fairly Groomed  Patent attorney::  Minimal  Speech:  Clear and Coherent and Normal Rate  Volume:  Normal  Mood:  Anxious and Depressed, improving  Affect:  Depressed and Flat  Thought Process:  Goal Directed  Orientation:  Full (Time, Place, and Person)  Thought Content:  Hallucinations: denies  Suicidal Thoughts:  No  Homicidal Thoughts:  No  Memory:  Immediate;   Fair Recent;   Fair Remote;   Fair  Judgement:  Impaired  Insight:  Lacking  Psychomotor Activity:  Restlessness improving  Concentration:  Fair  Recall:  [employer] of Knowledge:Fair  Language: Good  Akathisia:  No  Handed:  Right  AIMS (if indicated):     Assets:  Communication Skills Housing Physical Health  Sleep:  Number of Hours: 6.75   Musculoskeletal: Strength & Muscle Tone: within normal limits Gait & Station: normal Patient leans: N/A  Current Medications: Current Facility-Administered Medications  Medication Dose Route Frequency Provider Last Rate Last Dose  . acetaminophen (TYLENOL) tablet 650 mg  650 mg Oral Q6H PRN Nanine Means, NP      . alum & mag hydroxide-simeth (MAALOX/MYLANTA) 200-200-20 MG/5ML suspension 30 mL  30 mL Oral Q4H PRN Nanine Means, NP      . benztropine (COGENTIN) tablet 0.5 mg  0.5 mg Oral QHS Katalena Malveaux, MD   0.5 mg at 10/23/13 2130   Or  . benztropine mesylate (COGENTIN) injection 0.5 mg  0.5 mg Intramuscular QHS Jomarie Longs, MD      .  diphenhydrAMINE (BENADRYL) capsule 25 mg  25 mg Oral Q6H PRN Jomarie Longs, MD       Or  . diphenhydrAMINE (BENADRYL) injection 25 mg  25 mg Intramuscular Q6H PRN Jomarie Longs, MD   25 mg at 10/20/13 1857  . lamoTRIgine (LAMICTAL) tablet 25 mg  25 mg Oral BID Cleotis Nipper, MD   25 mg at 10/24/13 0758  . LORazepam (ATIVAN) tablet 0.5 mg  0.5 mg Oral Q6H PRN Jomarie Longs, MD   0.5 mg at 10/22/13 1010  . magnesium hydroxide (MILK OF MAGNESIA) suspension  30 mL  30 mL Oral Daily PRN Nanine Means, NP      . menthol-cetylpyridinium (CEPACOL) lozenge 3 mg  1 lozenge Oral Daily PRN Jomarie Longs, MD   3 mg at 10/20/13 1552  . OLANZapine zydis (ZYPREXA) disintegrating tablet 10 mg  10 mg Oral QHS Jomarie Longs, MD       Or  . OLANZapine (ZYPREXA) injection 10 mg  10 mg Intramuscular QHS Roniya Tetro, MD      . OLANZapine zydis (ZYPREXA) disintegrating tablet 5 mg  5 mg Oral Q8H PRN Beau Fanny, FNP   5 mg at 10/22/13 1009   Or  . OLANZapine (ZYPREXA) injection 5 mg  5 mg Intramuscular Q8H PRN Beau Fanny, FNP      . ondansetron (ZOFRAN-ODT) disintegrating tablet 4 mg  4 mg Oral Q8H PRN Jomarie Longs, MD      . traZODone (DESYREL) tablet 50 mg  50 mg Oral QHS PRN Jomarie Longs, MD        Lab Results:  No results found for this or any previous visit (from the past 48 hour(s)).  Physical Findings: AIMS: Facial and Oral Movements Muscles of Facial Expression: None, normal Lips and Perioral Area: None, normal Jaw: None, normal Tongue: None, normal,Extremity Movements Upper (arms, wrists, hands, fingers): None, normal Lower (legs, knees, ankles, toes): None, normal, Trunk Movements Neck, shoulders, hips: None, normal, Overall Severity Severity of abnormal movements (highest score from questions above): None, normal Incapacitation due to abnormal movements: None, normal Patient's awareness of abnormal movements (rate only patient's report): No Awareness, Dental Status Current problems with teeth and/or dentures?: No Does patient usually wear dentures?: No  CIWA:    COWS:     Treatment Plan Summary: Daily contact with patient to assess and evaluate symptoms and progress in treatment Medication management  Plan: 1.The patient remains very wooried, irritable, stressed and depressed, continue current treatment plan. 2. Continue admission for treatment  patient does not have any rash, itching or any tremors.   Increase Zyprexa to 10  mg at bedtime and continue Lamictal 25 mg twice a day.  This patient was refusing medications and is on  Forced medication order. Patient however is currently compliant with treatment. 3. Develop treatment plan to decrease risk of relapse upon discharge of depressive symptoms and the need for readmission.  5. Group therapy to facilitate development of healthy coping skills to use for depression and anxiety.  6. Health care follow up as needed for medical problems.  7. Discharge plan to include therapy to help patient cope with stressors.  8. Call for Consult with Hospitalist for additional specialty patient services as needed. 9. Will order labs - lipid panel as well as Hemoglobin A1c.    Medical Decision Making Problem Points:  Established problem, stable/improving (1), Review of last therapy session (1) and Review of psycho-social stressors (1) Data Points:  Review or order clinical  lab tests (1) Review or order medicine tests (1) Review and summation of old records (2) Review of medication regiment & side effects (2)  I certify that inpatient services furnished can reasonably be expected to improve the patient's condition.   Jona Erkkila MD 10/24/2013, 1:02 PM,

## 2013-10-24 NOTE — Progress Notes (Signed)
Patient ID: Debbie King, female   DOB: 1995-12-06, 18 y.o.   MRN: [id number] D: client visible on unit, interacting with peers. Client reports depression at "3" of 10 and anxiety at "2" of 10. "medication makes me real happy"  Reports day went well. 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  Client denies SHI. A: Writer introduced self to client, provides emotional support, encouraged her to express any concerns she may have. Staff will monitor q52min for safety. R: Client is safe on the unit, attended group.

## 2013-10-24 NOTE — [facility] Group Notes (Signed)
[facility] LCSW Aftercare Discharge Planning Group Note   10/24/2013 10:41 AM  Participation Quality:  Appropriate   Mood/Affect:  Lethargic  Depression Rating:  0  Anxiety Rating:  0  Thoughts of Suicide:  No Will you contract for safety?   NA  Current AVH:  No  Plan for Discharge/Comments:  Pt reports that she had trouble waking up this morning and feels dehydrated. She is hoping for d/c today or tomorrow and plans to live with her mom or in extended stay hotel until she can get a job and afford her own place. Pt requesting school note and bus pass.   Transportation Means: bus pass needed   Supports: pt's mother.   [employer], [employer]

## 2013-10-24 NOTE — Progress Notes (Signed)
Patient ID: Debbie King, female   DOB: 1995/07/31, 18 y.o.   MRN: [id number]  D: Patient has a flat affect on approach today. Reports symptoms of depression, hopelessness, and anxiety at a "2" on scales with 10 being the worst. Reports that she slept about 10 hours due to the medication. Reports that she feels the medication is too much. Denies hearing any voices and contracts for safety on the unit.  A: Staff will monitor on q 15 minute checks, follow treatment plan, and give medications as ordered. R: Cooperative on the unit at present.

## 2013-10-25 LAB — HEMOGLOBIN A1C
Hgb A1c MFr Bld: 5.3 % (ref <5.7)
MEAN PLASMA GLUCOSE: 105 mg/dL (ref <117)

## 2013-10-25 LAB — LIPID PANEL
Cholesterol: 140 mg/dL (ref 0–200)
HDL: 44 mg/dL (ref >39)
LDL CALC: 68 mg/dL (ref 0–99)
Total CHOL/HDL Ratio: 3.2 RATIO
Triglycerides: 140 mg/dL (ref <150)
VLDL: 28 mg/dL (ref 0–40)

## 2013-10-25 NOTE — [facility] Group Notes (Signed)
Adult Psychoeducational Group Note  Date:  10/25/2013 Time:  0900am  Group Topic/Focus:  Goals Group:   The focus of this group is to help patients establish daily goals to achieve during treatment and discuss how the patient can incorporate goal setting into their daily lives to aide in recovery.  Participation Level:  Minimal  Participation Quality:  Drowsy and Resistant  Affect:  Blunted and Flat  Cognitive:  Appropriate  Insight: Lacking  Engagement in Group:  Defensive and Off Topic  Modes of Intervention:  Clarification, Discussion, Education, Orientation, Rapport Building and Support  Additional Comments:  Pt was off topic when she shared in the group, pt was wanting the group to be with the social worker and was not happy that the group started a little group, she voiced this at the beginning of the group and then did not participate any further.  Alfonse Spruce 10/25/2013, 11:13 AM

## 2013-10-25 NOTE — Progress Notes (Signed)
Adult Psychoeducational Group Note  Date:  10/25/2013 Time:  9:54 PM  Group Topic/Focus:  Wrap-Up Group:   The focus of this group is to help patients review their daily goal of treatment and discuss progress on daily workbooks.  Participation Level:  Minimal  Participation Quality:  Appropriate  Affect:  Flat  Cognitive:  Appropriate  Insight: Limited  Engagement in Group:  Limited  Modes of Intervention:  Socialization and Support  Additional Comments:  Patient attended and participated in group tonight. She reports that she went to her groups, took her medications. She describe today as the best day since she has been here. The patient advised that she felt a little tired today but not for long. She felt better when she was told she was leaving tomorrow.  Lita Mains [facility] 10/25/2013, 9:54 PM

## 2013-10-25 NOTE — Progress Notes (Signed)
D:  Per pt self inventory pt reports sleeping fair, appetite fair, energy level normal, ability to pay attention good, rates depression at a 0 and hopelessness at a 0, anxiety at a 0, pt flat-states that she feels drowsy--goal for today is to go to groups and take her medication, denies SI/HI/AVH.     A:  Emotional support provided, Encouraged pt to continue with treatment plan and attend all group activities, q15 min checks maintained for safety.  R:  Pt is receptive, feels like she could be discharged today however she states that she is ok with staying another day, calm and cooperative with staff and other patients.

## 2013-10-25 NOTE — [facility] Group Notes (Signed)
[facility] LCSW Group Therapy  10/25/2013 , 1:53 PM   Type of Therapy:  Group Therapy  Participation Level:  Active  Participation Quality:  Attentive  Affect:  Appropriate  Cognitive:  Alert  Insight:  Improving  Engagement in Therapy:  Engaged  Modes of Intervention:  Discussion, Exploration and Socialization  Summary of Progress/Problems: Today's group focused on the term Diagnosis.  Participants were asked to define the term, and then pronounce whether it is a negative, positive or neutral term.  [name] was engaged intermittently in group.  She spent time apparently sleeping or resting, but also contributed to the discussion.  She defined diagnosis as "something that helps you get rid of your symptoms" and talked about how medication has helped her, despite her reluctance to take it.  "You know it's just street drugs wrapped up as medication."  When pushed by another patient about this, she stated that thinking of it that way helps her accept taking the mes, but it makes her mad when her mother tells her she has to take meds the rest of her life.  Daryel Gerald B 10/25/2013 , 1:53 PM

## 2013-10-25 NOTE — Tx Team (Signed)
  Interdisciplinary Treatment Plan Update   Date Reviewed:  10/25/2013  Time Reviewed:  10:32 AM  Progress in Treatment:   Attending groups: Yes Participating in groups: Yes Taking medication as prescribed: Yes  Tolerating medication: Yes Family/Significant other contact made: Yes  Patient understands diagnosis: Yes  Discussing patient identified problems/goals with staff: Yes  See initial care plan Medical problems stabilized or resolved: Yes Denies suicidal/homicidal ideation: Yes  In tx team Patient has not harmed self or others: Yes  For review of initial/current patient goals, please see plan of care.  Estimated Length of Stay:  Likely d/c tomorrow  Reason for Continuation of Hospitalization:   New Problems/Goals identified:  N/A  Discharge Plan or Barriers:   stay with mother, follow up [facility] in [place]  Additional Comments:  Attendees:  Signature: Ivin Booty, MD 10/25/2013 10:32 AM   Signature: Richelle Ito, LCSW 10/25/2013 10:32 AM  Signature:  10/25/2013 10:32 AM  Signature:  10/25/2013 10:32 AM  Signature: Liborio Nixon, RN 10/25/2013 10:32 AM  Signature:  10/25/2013 10:32 AM  Signature:   10/25/2013 10:32 AM  Signature:    Signature:    Signature:    Signature:    Signature:    Signature:      Scribe for Treatment Team:   Richelle Ito, LCSW  10/25/2013 10:32 AM

## 2013-10-25 NOTE — Progress Notes (Addendum)
Patient ID: Debbie King, female   DOB: [birth year], 18 y.o.   MRN: [id number] Patient ID: Debbie King, female   DOB: August 15, 1995, 18 y.o.   MRN: [id number]  [facility] MD Progress Note  Subjective: Debbie King reports, " I feel better, I am less anxious"  Objective: Pt seen and chart reviewed. Debbie King has more stable moods and denies any racing thoughts. Reports medications as working well. Patient denies SI/HI/AH/VH. Patient is more calm and cooperative and is compliant with medications. Reports that the medications makes her a bit sleepy in the AM ,but she is willing to wait and watch and give it more time.  Per staff patient has improved a lot and has been compliant with medications.  Diagnosis:   DSM5: Primary Psychiatric diagnosis: Schizoaffective disorder, depressive type, multiple episodes ,currently in acute episode  Secondary psychiatric diagnosis: Non compliance with treatment  ADL's:  Intact  Sleep: Good  Appetite:  Good   Psychiatric Specialty Exam: Physical Exam  Constitutional: She is oriented to person, place, and time. She appears well-developed.  Musculoskeletal: Normal range of motion.  Neurological: She is alert and oriented to person, place, and time.  Skin: Skin is warm.  Moderate acne  Psychiatric: She has a normal mood and affect. Her behavior is normal.    Review of Systems  Psychiatric/Behavioral: Positive for depression (improving). Negative for suicidal ideas, hallucinations, memory loss and substance abuse. The patient is nervous/anxious (improving).   All other systems reviewed and are negative.   Blood pressure 110/66, pulse 119, temperature 98 F (36.7 C), temperature source Oral, resp. rate 16, height  (1.6 m), weight 64.411 kg (142 lb), last menstrual period 10/12/2013.Body mass index is 25.16 kg/(m^2).  General Appearance: Fairly Groomed  Patent attorney::  Minimal  Speech:  Clear and Coherent and Normal Rate  Volume:  Normal  Mood:  Anxious and  Depressed, improving  Affect:  Depressed and Flat  Thought Process:  Goal Directed  Orientation:  Full (Time, Place, and Person)  Thought Content:  Hallucinations: denies  Suicidal Thoughts:  No  Homicidal Thoughts:  No  Memory:  Immediate;   Fair Recent;   Fair Remote;   Fair  Judgement:  Impaired  Insight:  Lacking  Psychomotor Activity:  Restlessness improving  Concentration:  Fair  Recall:  [employer] of Knowledge:Fair  Language: Good  Akathisia:  No  Handed:  Right  AIMS (if indicated):     Assets:  Communication Skills Housing Physical Health  Sleep:  Number of Hours: 5.25   Musculoskeletal: Strength & Muscle Tone: within normal limits Gait & Station: normal Patient leans: N/A  Current Medications: Current Facility-Administered Medications  Medication Dose Route Frequency Provider Last Rate Last Dose  . acetaminophen (TYLENOL) tablet 650 mg  650 mg Oral Q6H PRN Nanine Means, NP      . alum & mag hydroxide-simeth (MAALOX/MYLANTA) 200-200-20 MG/5ML suspension 30 mL  30 mL Oral Q4H PRN Nanine Means, NP      . benztropine (COGENTIN) tablet 0.5 mg  0.5 mg Oral QHS Ermelinda Eckert, MD   0.5 mg at 10/24/13 2146   Or  . benztropine mesylate (COGENTIN) injection 0.5 mg  0.5 mg Intramuscular QHS Marilu Rylander, MD      . diphenhydrAMINE (BENADRYL) capsule 25 mg  25 mg Oral Q6H PRN Jomarie Longs, MD       Or  . diphenhydrAMINE (BENADRYL) injection 25 mg  25 mg Intramuscular Q6H PRN Jomarie Longs, MD   25 mg  at 10/20/13 1857  . lamoTRIgine (LAMICTAL) tablet 25 mg  25 mg Oral BID Cleotis Nipper, MD   25 mg at 10/25/13 0843  . LORazepam (ATIVAN) tablet 0.5 mg  0.5 mg Oral Q6H PRN Jomarie Longs, MD   0.5 mg at 10/22/13 1010  . magnesium hydroxide (MILK OF MAGNESIA) suspension 30 mL  30 mL Oral Daily PRN Nanine Means, NP      . menthol-cetylpyridinium (CEPACOL) lozenge 3 mg  1 lozenge Oral Daily PRN Jomarie Longs, MD   3 mg at 10/20/13 1552  . OLANZapine zydis (ZYPREXA)  disintegrating tablet 10 mg  10 mg Oral QHS Jomarie Longs, MD   10 mg at 10/24/13 2146   Or  . OLANZapine (ZYPREXA) injection 10 mg  10 mg Intramuscular QHS Sakina Briones, MD      . OLANZapine zydis (ZYPREXA) disintegrating tablet 5 mg  5 mg Oral Q8H PRN Beau Fanny, FNP   5 mg at 10/22/13 1009   Or  . OLANZapine (ZYPREXA) injection 5 mg  5 mg Intramuscular Q8H PRN Beau Fanny, FNP      . ondansetron (ZOFRAN-ODT) disintegrating tablet 4 mg  4 mg Oral Q8H PRN Jomarie Longs, MD      . traZODone (DESYREL) tablet 50 mg  50 mg Oral QHS PRN Jomarie Longs, MD        Lab Results:  Results for orders placed during the hospital encounter of 10/14/13 (from the past 48 hour(s))  LIPID PANEL     Status: None   Collection Time    10/25/13  6:27 AM      Result Value Ref Range   Cholesterol 140  0 - 200 mg/dL   Triglycerides 161  <096 mg/dL   HDL 44  >04 mg/dL   Total CHOL/HDL Ratio 3.2     VLDL 28  0 - 40 mg/dL   LDL Cholesterol 68  0 - 99 mg/dL   Comment:            Total Cholesterol/HDL:CHD Risk     Coronary Heart Disease Risk Table                         Men   Women      1/2 Average Risk   3.4   3.3      Average Risk       5.0   4.4      2 X Average Risk   9.6   7.1      3 X Average Risk  23.4   11.0                Use the calculated Patient Ratio     above and the CHD Risk Table     to determine the patient's CHD Risk.                ATP III CLASSIFICATION (LDL):      <100     mg/dL   Optimal      540-981  mg/dL   Near or Above                        Optimal      130-159  mg/dL   Borderline      191-478  mg/dL   High      >295     mg/dL   Very High     Performed at  [facility]    Physical Findings: AIMS: Facial and Oral Movements Muscles of Facial Expression: None, normal Lips and Perioral Area: None, normal Jaw: None, normal Tongue: None, normal,Extremity Movements Upper (arms, wrists, hands, fingers): None, normal Lower (legs, knees, ankles, toes): None,  normal, Trunk Movements Neck, shoulders, hips: None, normal, Overall Severity Severity of abnormal movements (highest score from questions above): None, normal Incapacitation due to abnormal movements: None, normal Patient's awareness of abnormal movements (rate only patient's report): No Awareness, Dental Status Current problems with teeth and/or dentures?: No Does patient usually wear dentures?: No  CIWA:    COWS:     Treatment Plan Summary: Daily contact with patient to assess and evaluate symptoms and progress in treatment Medication management  Plan: 1.The patient remains very wooried, irritable, stressed and depressed, continue current treatment plan. 2. Continue admission for treatment  patient does not have any rash, itching or any tremors.   Continue Zyprexa  10 mg at bedtime and continue Lamictal 25 mg twice a day.  This patient was refusing medications and is on  Forced medication order. Patient however is currently compliant with treatment. 3. Develop treatment plan to decrease risk of relapse upon discharge of depressive symptoms and the need for readmission.  5. Group therapy to facilitate development of healthy coping skills to use for depression and anxiety.  6. Health care follow up as needed for medical problems.  7. Discharge plan to include therapy to help patient cope with stressors.  8. Call for Consult with Hospitalist for additional specialty patient services as needed. 9. Labs reviewed -Lipid panel -10/25/13 -[facility]l.  Medical Decision Making Problem Points:  Established problem, stable/improving (1), Review of last therapy session (1) and Review of psycho-social stressors (1) Data Points:  Review or order clinical lab tests (1) Review or order medicine tests (1) Review and summation of old records (2) Review of medication regiment & side effects (2)  I certify that inpatient services furnished can reasonably be expected to improve the patient's condition.    Oreoluwa Aigner MD 10/25/2013, 11:29 AM,

## 2013-10-26 MED ORDER — TRAZODONE HCL 50 MG PO TABS: 50.0000 mg | ORAL_TABLET | Freq: Every evening | ORAL | Status: DC | PRN

## 2013-10-26 MED ORDER — BENZTROPINE MESYLATE 0.5 MG PO TABS: 0.5000 mg | ORAL_TABLET | Freq: Every day | ORAL | Status: DC

## 2013-10-26 MED ORDER — LAMOTRIGINE 25 MG PO TABS: 25.0000 mg | ORAL_TABLET | Freq: Two times a day (BID) | ORAL | Status: DC

## 2013-10-26 MED ORDER — LORAZEPAM 0.5 MG PO TABS: 0.5000 mg | ORAL_TABLET | Freq: Four times a day (QID) | ORAL | Status: DC | PRN

## 2013-10-26 MED ORDER — OLANZAPINE 10 MG PO TBDP: 10.0000 mg | ORAL_TABLET | Freq: Every day | ORAL | Status: DC

## 2013-10-26 NOTE — [facility] Suicide Risk Assessment (Signed)
   Demographic Factors:  Caucasian and Unemployed  Total Time spent with patient: 20 minutes  Psychiatric Specialty Exam: Physical Exam  Constitutional: She is oriented to person, place, and time. She appears well-developed and well-nourished.  HENT:  Head: Normocephalic and atraumatic.  Neck: Normal range of motion.  Respiratory: Effort normal.  GI: Soft.  Musculoskeletal: Normal range of motion.  Neurological: She is alert and oriented to person, place, and time.  Skin: Skin is warm.  Psychiatric: She has a normal mood and affect. Her speech is normal and behavior is normal. Judgment and thought content normal. Cognition and memory are normal.    Review of Systems  Constitutional: Negative.   HENT: Negative.   Eyes: Negative.   Respiratory: Negative.   Cardiovascular: Negative.   Gastrointestinal: Negative.   Genitourinary: Negative.   Musculoskeletal: Negative.   Skin: Negative.   Neurological: Negative.   Psychiatric/Behavioral: Negative for suicidal ideas and hallucinations. The patient is not nervous/anxious and does not have insomnia.     Blood pressure 110/66, pulse 119, temperature 98 F (36.7 C), temperature source Oral, resp. rate 16, height  (1.6 m), weight 64.411 kg (142 lb), last menstrual period 10/12/2013.Body mass index is 25.16 kg/(m^2).  General Appearance: Casual  Eye Contact::  Good  Speech:  Clear and Coherent  Volume:  Normal  Mood:  Euthymic  Affect:  Appropriate  Thought Process:  Coherent  Orientation:  Full (Time, Place, and Person)  Thought Content:  WDL  Suicidal Thoughts:  No  Homicidal Thoughts:  No  Memory:  Immediate;   Fair Recent;   Good Remote;   Good  Judgement:  Fair  Insight:  Fair  Psychomotor Activity:  Normal  Concentration:  Good  Recall:  Fair  Fund of Knowledge:Good  Language: Good  Akathisia:  No    AIMS (if indicated):   0  Assets:  Communication Skills Social Support  Sleep:  Number of Hours: 6.5     Musculoskeletal: Strength & Muscle Tone: within normal limits Gait & Station: normal Patient leans: N/A   Mental Status Per Nursing Assessment::   On Admission:     Current Mental Status by Physician: Denies SI/HI/AH/VH  Loss Factors: Decrease in vocational status  Historical Factors: Family history of mental illness or substance abuse and Impulsivity  Risk Reduction Factors:   Positive social support and Positive therapeutic relationship  Continued Clinical Symptoms:  Previous Psychiatric Diagnoses and Treatments  Cognitive Features That Contribute To Risk:  Polarized thinking    Suicide Risk:  Minimal: No identifiable suicidal ideation.  Patients presenting with no risk factors but with morbid ruminations; may be classified as minimal risk based on the severity of the depressive symptoms  Discharge Diagnoses:  DSM5:  Primary Psychiatric diagnosis:  Schizoaffective disorder, depressive type, multiple episodes ,currently in (acute episode -RESOLVED) Secondary psychiatric diagnosis:  Non compliance with treatment(resolved)   Past Medical History  Diagnosis Date  . Depression     Plan Of Care/Follow-up recommendations:  Activity:  no restrictions  Is patient on multiple antipsychotic therapies at discharge:  No   Has Patient had three or more failed trials of antipsychotic monotherapy by history:  No  Recommended Plan for Multiple Antipsychotic Therapies: NA    Shameca Landen MD 10/26/2013, 10:15 AM

## 2013-10-26 NOTE — Discharge Summary (Signed)
Physician Discharge Summary Note  Patient:  Debbie King is an 18 y.o., female MRN:  [id number] DOB:  1995-04-29 Patient phone:  [phone] (home)  Patient address:   [address] [place] [address],  Total Time spent with patient: Greater than 30 minutes  Date of Admission:  10/14/2013 Date of Discharge: 10/26/13  Reason for Admission: Mood stabilization treatments  Discharge Diagnoses: Active Problems:   Major depressive disorder, single episode, severe, without mention of psychotic behavior   Psychiatric Specialty Exam: Physical Exam  Psychiatric: Her speech is normal and behavior is normal. Judgment and thought content normal. Her mood appears not anxious. Her affect is not angry, not blunt, not labile and not inappropriate. Cognition and memory are normal. She does not exhibit a depressed mood.    Review of Systems  Constitutional: Negative.   HENT: Negative.   Eyes: Negative.   Respiratory: Negative.   Cardiovascular: Negative.   Gastrointestinal: Negative.   Genitourinary: Negative.   Musculoskeletal: Negative.   Skin: Negative.   Endo/Heme/Allergies: Negative.   Psychiatric/Behavioral: Positive for depression (Stabilized with medication prior to discharge). Negative for suicidal ideas, hallucinations, memory loss and substance abuse. The patient has insomnia (Stable). The patient is not nervous/anxious.     Blood pressure 110/66, pulse 119, temperature 98 F (36.7 C), temperature source Oral, resp. rate 16, height  (1.6 m), weight 64.411 kg (142 lb), last menstrual period 10/12/2013.Body mass index is 25.16 kg/(m^2).  General Appearance: Casual  Eye Contact:: Good  Speech: Clear and Coherent  Volume: Normal  Mood: Euthymic  Affect: Appropriate  Thought Process: Coherent  Orientation: Full (Time, Place, and Person)  Thought Content: WDL  Suicidal Thoughts: No  Homicidal Thoughts: No Memory: Immediate; Fair  Recent; Good  Remote; Good  Judgement:  Fair  Insight: Fair  Psychomotor Activity: Normal  Concentration: Good  Recall: Fair  Fund of Knowledge:Good  Language: Good  Akathisia: No  AIMS (if indicated): 0 Assets: Communication Skills  Social Support  Sleep: Number of Hours: 6.5   Past Psychiatric History: Diagnosis: Major depressive disorder, single episode, severe, without mention of psychotic behavior  Hospitalizations: [facility] adult unit  Outpatient Care: [facility] in [place], [place]  Substance Abuse Care: NA  Self-Mutilation: NA  Suicidal Attempts: NA  Violent Behaviors: NA   Musculoskeletal: Strength & Muscle Tone: within normal limits Gait & Station: normal Patient leans: N/A    DSM5:  Primary Psychiatric diagnosis:  Schizoaffective disorder, depressive type, multiple episodes ,currently in (acute episode -RESOLVED)   Secondary psychiatric diagnosis:  Non compliance with treatment(resolved      Past Medical History  Diagnosis Date  . Depression    Level of Care:  OP  Hospital Course:  Debbie King is a 18 year old female who was brought in to [facility] by a stranger after being found walking on [address]. At that time the patient expressed suicidal ideations. Patient states today "I get these obsessive thoughts. I wanted to walk in front of a car. I feel like I will be dead within a year. I am falling apart. I feel tired all the time and my body twitches. I took medication before and had a bad reaction. I started itching. I just feel that I need coping skills."   Debbie King UDS/toxicology test results on admission failed to indicate any substance in her systems. However, admission assessment/evaluation revealed based on her presenting symptoms that Debbie King will benefit from mood stabilization treatments.  Patient refused all her medications on the  unit initially. She was started on several medications like Abilify ,Remeron ,wellbutrin initially ,but she kept refusing or endorsed side effects to them.  Collateral information was obtained from mother ,who reported that patient was hearing voices as well as talking in different voices before coming to the hospital. Even with the ongoing medication management and daily counseling sessions to help improve Debbie King mood and coping skills,  Debbie King presented with poor insight of her mental health condition. She was also bombarded with the need to continue her schooling at the [employer] college. Debbie King presented some frustration towards her mother whom she stated took it upon herself to sign her out from college.  So patient was started on forced medication order.  She was started on Zyprexa titrated up to 10 mg po qhs and Lamictal 50 mg daily. She continued to improve and became compliant on medications. She was discharged; on Olanzapine Zydis 10 mg Q bedtime for mod control, Lamictal 25 mg twice daily for mood stabilization , cogentin 0.5 mg Q bedtime for prevention of EPS and Trazodone 50 mg Q bedtime for sleep. She was also enrolled in and participated in the group counseling sessions being offered and held on this unit. She learned coping skills.  Even with the ongoing medication management and daily counseling sessions to help improve Debbie King mood and coping skills,  Debbie King presented with poor insight of her mental health condition. She was also bombarded with the need to continue her schooling at the [employer] college. Debbie King presented some frustration towards her mother whom she stated took it upon herself to sign her out from college. She was being encouraged to continue treatment and counseling sessions to help improve her mood.  Debbie King symptoms responded well to her treatment regimen. This is evidenced by her reports of improved mood, absence of suicidal ideations and or hallucinations. Sh was eventually motivated for recovery. She worked closely with the treatment team and case manager to develop a discharge plan with appropriate goals. Coping skills, problem  solving as well as relaxation therapies were also part of the unit programming. On the day of discharge Debbie King was in much improved condition than upon admission.  Her symptoms were reported as significantly decreased or resolved completely. She was motivated to continue taking medication with a goal of continued improvement in mental health. She will continue further psychiatric care and medication management at the [facility] clinic in [place], [place], and counseling services at the [facility].  Upon discharge, she adamantly denies any SIHI, AVH, delusional thoughts and or paranoia. She received from the [facility] pharmacy, a 14 days worth, supply samples of her [facility] discharge medications. She left [facility] with all personal belongings in no distress. Transportation per family.  Consults:  psychiatry  Significant Diagnostic Studies:  labs: CBC with diff, CMP, UDS, toxicology tests, U/A, results reviewed, stable  Discharge Vitals:   Blood pressure 110/66, pulse 119, temperature 98 F (36.7 C), temperature source Oral, resp. rate 16, height  (1.6 m), weight 64.411 kg (142 lb), last menstrual period 10/12/2013. Body mass index is 25.16 kg/(m^2). Lab Results:   Results for orders placed during the hospital encounter of 10/14/13 (from the past 72 hour(s))  LIPID PANEL     Status: None   Collection Time    10/25/13  6:27 AM      Result Value Ref Range   Cholesterol 140  0 - 200 mg/dL   Triglycerides 161  <096 mg/dL   HDL 44  >04 mg/dL  Total CHOL/HDL Ratio 3.2     VLDL 28  0 - 40 mg/dL   LDL Cholesterol 68  0 - 99 mg/dL   Comment:            Total Cholesterol/HDL:CHD Risk     Coronary Heart Disease Risk Table                         Men   Women      1/2 Average Risk   3.4   3.3      Average Risk       5.0   4.4      2 X Average Risk   9.6   7.1      3 X Average Risk  23.4   11.0                Use the calculated Patient Ratio     above and the CHD Risk Table     to  determine the patient's CHD Risk.                ATP III CLASSIFICATION (LDL):      <100     mg/dL   Optimal      960-454  mg/dL   Near or Above                        Optimal      130-159  mg/dL   Borderline      098-119  mg/dL   High      >147     mg/dL   Very High     Performed at [facility]  HEMOGLOBIN A1C     Status: None   Collection Time    10/25/13  6:27 AM      Result Value Ref Range   Hemoglobin A1C 5.3  <5.7 %   Comment: (NOTE)                                                                               According to the ADA Clinical Practice Recommendations for 2011, when     HbA1c is used as a screening test:      >=6.5%   Diagnostic of Diabetes Mellitus               (if abnormal result is confirmed)     5.7-6.4%   Increased risk of developing Diabetes Mellitus     References:Diagnosis and Classification of Diabetes Mellitus,Diabetes     Care,2011,34(Suppl 1):S62-S69 and Standards of Medical Care in             Diabetes - 2011,Diabetes Care,2011,34 (Suppl 1):S11-S61.   Mean Plasma Glucose 105  <117 mg/dL   Comment: Performed at [facility]    Physical Findings: AIMS: Facial and Oral Movements Muscles of Facial Expression: None, normal Lips and Perioral Area: None, normal Jaw: None, normal Tongue: None, normal,Extremity Movements Upper (arms, wrists, hands, fingers): None, normal Lower (legs, knees, ankles, toes): None, normal, Trunk Movements Neck, shoulders, hips: None, normal, Overall Severity Severity of abnormal movements (highest score from questions above): None, normal  Incapacitation due to abnormal movements: None, normal Patient's awareness of abnormal movements (rate only patient's report): No Awareness, Dental Status Current problems with teeth and/or dentures?: No Does patient usually wear dentures?: No  CIWA:    COWS:     Psychiatric Specialty Exam: See Psychiatric Specialty Exam and Suicide Risk Assessment completed by  Attending Physician prior to discharge.  Discharge destination:  Home  Is patient on multiple antipsychotic therapies at discharge:  No   Has Patient had three or more failed trials of antipsychotic monotherapy by history:  No  Recommended Plan for Multiple Antipsychotic Therapies: NA    Medication List       Indication   benztropine 0.5 MG tablet  Commonly known as:  COGENTIN  Take 1 tablet (0.5 mg total) by mouth at bedtime. For prevention of drug induced involuntary movements   Indication:  Extrapyramidal Reaction caused by Medications     lamoTRIgine 25 MG tablet  Commonly known as:  LAMICTAL  Take 1 tablet (25 mg total) by mouth 2 (two) times daily. For mood stabilization   Indication:  Mood control     LORazepam 0.5 MG tablet  Commonly known as:  ATIVAN  Take 1 tablet (0.5 mg total) by mouth every 6 (six) hours as needed for anxiety.   Indication:  Feeling Anxious     OLANZapine zydis 10 MG disintegrating tablet  Commonly known as:  ZYPREXA  Take 1 tablet (10 mg total) by mouth at bedtime. For mood control   Indication:  Mood control     traZODone 50 MG tablet  Commonly known as:  DESYREL  Take 1 tablet (50 mg total) by mouth at bedtime as needed for sleep.   Indication:  Trouble Sleeping       Follow-up Information   Follow up with [facility] On 10/27/2013. (Thursday at 1:00)    Contact information:   [address]  [place]  [phone]      Follow up with [facility]. Marline Backbone is out of the office until Monday Sept. 28th. You will need to call her on Monday to make a follow up appointment.  )    Contact information:   [address]  [facility]  [phone]     Follow-up recommendations: Activity:  As tolerated Diet: As recommended by your primary care doctor. Keep all scheduled follow-up appointments as recommended.   Comments:  Take all your medications as prescribed by your mental healthcare provider. Report any  adverse effects and or reactions from your medicines to your outpatient provider promptly. Patient is instructed and cautioned to not engage in alcohol and or illegal drug use while on prescription medicines. In the event of worsening symptoms, patient is instructed to call the crisis hotline, 911 and or go to the nearest ED for appropriate evaluation and treatment of symptoms. Follow-up with your primary care provider for your other medical issues, concerns and or health care needs.  Total Discharge Time:  Greater than 30 minutes.  Signed: Sanjuana Kava, PMHNP 10/26/2013, 2:17 PM  Patient was seen face to face for psychiatric evaluation, suicide risk assessment and case discussed with treatment team and NP and made appropriate disposition plans. Reviewed the information documented and agree with the treatment plan.     Jomarie Longs ,MD Attending Psychiatrist  [facility]

## 2013-10-26 NOTE — Progress Notes (Signed)
Discharge Note: Discharge instructions/prescriptions/medication samples given to patient. Patient verbalized understanding of discharge instructions and prescriptions. Returned belongings to patient. Denies SI/HI/AVH. Patient d/c without incident to the front lobby and transported home by her father. 

## 2013-10-26 NOTE — Progress Notes (Deleted)
Post Nursing 1:1 Note  D: Patient is walking up and down the hall with a fixed smile. She chose to not go to the cafeteria for lunch. No signs or symptoms of distress noted.  A: Continue to maintain Q15 minute checks for safety.  R: Patient remains safe.

## 2013-10-26 NOTE — Progress Notes (Signed)
D: Pt denies SI/HI/AVH. Pt is pleasant and cooperative. Pt continues to have one tract mind. Pt is argumentative, in denial about her situation and continues to blame everyone and everything but herself for situations happening to her. Pt had to be moved into rm 406-2 due to confrontation with pt in 407-1.   A: Pt was offered support and encouragement. Pt was given scheduled medications. Pt was encourage to attend groups. Q 15 minute checks were done for safety.   R: Pt is taking medication.Pt receptive to treatment and safety maintained on unit.

## 2013-10-28 NOTE — Progress Notes (Signed)
Patient Discharge Instructions:  After Visit Summary (AVS):   Faxed to:  10/28/13 Discharge Summary Note:   Faxed to:  10/28/13 Psychiatric Admission Assessment Note:   Faxed to:  10/28/13 Suicide Risk Assessment - Discharge Assessment:   Faxed to:  10/28/13 Faxed/Sent to the Next Level Care provider:  10/28/13 Faxed to [facility] @ [phone] Faxed to [facility] @ [phone]  Jerelene Redden, 10/28/2013, 3:56 PM

## 2013-11-14 ENCOUNTER — Telehealth: Payer: Self-pay | Admitting: Neurology

## 2013-11-14 ENCOUNTER — Ambulatory Visit: Payer: Self-pay | Admitting: Neurology

## 2013-11-14 NOTE — Telephone Encounter (Signed)
Pt referred by ER for np appt. Pt no showed NP appt today.  Alcario DroughtErica - please send no show letter / Oneita KrasSherri S

## 2013-11-15 ENCOUNTER — Encounter: Payer: Self-pay | Admitting: *Deleted

## 2013-11-15 NOTE — Progress Notes (Signed)
No show letter sent for 11-14-2013 

## 2013-11-23 ENCOUNTER — Encounter (HOSPITAL_COMMUNITY): Payer: Self-pay | Admitting: *Deleted

## 2013-11-25 ENCOUNTER — Encounter: Payer: Self-pay | Admitting: Neurology

## 2013-11-25 ENCOUNTER — Ambulatory Visit (INDEPENDENT_AMBULATORY_CARE_PROVIDER_SITE_OTHER): Payer: PPO | Admitting: Neurology

## 2013-11-25 ENCOUNTER — Ambulatory Visit: Payer: PPO | Admitting: Neurology

## 2013-11-25 VITALS — BP 118/76 | HR 76 | Resp 16 | Ht 63.0 in | Wt 139.0 lb

## 2013-11-25 DIAGNOSIS — F329 Major depressive disorder, single episode, unspecified: Secondary | ICD-10-CM

## 2013-11-25 DIAGNOSIS — F32A Depression, unspecified: Secondary | ICD-10-CM

## 2013-11-25 DIAGNOSIS — R404 Transient alteration of awareness: Secondary | ICD-10-CM

## 2013-11-25 NOTE — Patient Instructions (Signed)
1. Schedule MRI brain with and without contrast 2. Schedule routine EEG, then plan for 24-hour EEG  3. As per [place] driving laws, for any episode of loss of awareness, one should not drive until 6 months event-free 4. Continue working with your psychiatrist and therapist

## 2013-11-25 NOTE — Progress Notes (Signed)
NEUROLOGY CONSULTATION NOTE  Debbie King MRN: [id number] DOB: 1995/07/19  Referring provider: [facility] Primary care provider: none listed  Reason for consult:  Possible seizures  Thank you for your kind referral of Debbie MediateMaria E King for consultation of the above symptoms. Although her history is well known to you, please allow me to reiterate it for the purpose of our medical record. Records and images were personally reviewed where available.  HISTORY OF PRESENT ILLNESS: This is a 18 year old right-handed woman with a history of major depressive disorder presenting for recurrent staring spells. She reports that she is always tired and sleepy, however people around her started noticing that she would stare several times a day. She reports that she had been relatively well until she overdosed on alcohol and Vyvanse in her junior year in high school. She got better after, then states that she became depressed after she started Mirena a year ago. She felt slow and fatigued. She started her freshman year in college and 4 months ago was told by teachers and students that she would have staring spells. She went to the ER on 09/05/13 because 4 people had told her about it in one day, head CT done was normal. At that time she was also having headaches which have resolved. She notices when the staring is happening, she tries to look down but looks like she is staring at people's shoes. She also reports episodes of deja vu lasting a few minutes. She denies any visual, auditory, olfactory or gustatory hallucinations, but endorses weird dreams and racing thoughts. She noticed she was talking to herself and was admitted to the psychiatric ward from Sept 11-23 for major depression. She was discharged on Lamictal, Zyprexa, Trazodone, and prn Ativan. She had stopped them 2 weeks ago, and started Tegretol 3 days ago. She reports Ativan made her back hurt, but did notice less staring spells while on the  Ativan.  She reports occasional muscle twitches, mostly her leg would jerk. She reports waking up 3 times in the night with brief shaking, no tongue bite/incontinence. At around age 18 or 44, she would pass out or vomit when she saw a bright light. When she would drink alcohol in the past, she would have body shaking. She reports that her body does not perceive hot or cold, for instance she would wear a jacket in hot weather. She reports taking Vyvanse in the past for ADD diagnosed in Elementary. She smoked cigarettes, pot, and tried cocaine once at age 18.  She had right occipital headaches, described "like a stress ball" where she could not move her head, no associated nausea, vomiting, photo/phonophobia, lasting for weeks at a time.She has been told this is due to depression so she has not been paying attention to it. She denies any dizziness, diplopia, dysarthria/dysphagia, focal numbness/tingling/weakness, bowel/bladder dysfunction.   Epilepsy Risk Factors:  She was born of a twin pregnancy, early by 6 weeks. Otherwise had a normal early development.  There is no history of febrile convulsions, CNS infections such as meningitis/encephalitis, significant traumatic brain injury, neurosurgical procedures, or family history of seizures (her mother had infertility treatments, she does not know biologic father's history)  PAST MEDICAL HISTORY: Past Medical History  Diagnosis Date  . Current non-smoker but past smoking history unknown   . Depression     PAST SURGICAL HISTORY: Past Surgical History  Procedure Laterality Date  . Tonsillectomy    . Wisdom tooth extraction  MEDICATIONS: No current outpatient prescriptions on file prior to visit.   No current facility-administered medications on file prior to visit.    ALLERGIES: Allergies  Allergen Reactions  . Citalopram Itching  . Haldol [Haloperidol Lactate]     FAMILY HISTORY: No family history on file.  SOCIAL HISTORY: History    Social History  . Marital Status: Single    Spouse Name: N/A    Number of Children: N/A  . Years of Education: N/A   Occupational History  . Not on file.   Social History Main Topics  . Smoking status: Former Games developer  . Smokeless tobacco: Not on file  . Alcohol Use: No  . Drug Use: No     Comment: Former Neurosurgeon  . Sexual Activity: Not on file   Other Topics Concern  . Not on file   Social History Narrative   ** Merged History Encounter **        REVIEW OF SYSTEMS: Constitutional: No fevers, chills, or sweats, no generalized fatigue, change in appetite Eyes: No visual changes, double vision, eye pain Ear, nose and throat: No hearing loss, ear pain, nasal congestion, sore throat Cardiovascular: No chest pain, palpitations Respiratory:  No shortness of breath at rest or with exertion, wheezes GastrointestinaI: No nausea, vomiting, diarrhea, abdominal pain, fecal incontinence Genitourinary:  No dysuria, urinary retention or frequency Musculoskeletal:  No neck pain, back pain Integumentary: No rash, pruritus, skin lesions Neurological: as above Psychiatric: + depression, insomnia, anxiety Endocrine: No palpitations, fatigue, diaphoresis, mood swings, change in appetite, change in weight, increased thirst Hematologic/Lymphatic:  No anemia, purpura, petechiae. Allergic/Immunologic: no itchy/runny eyes, nasal congestion, recent allergic reactions, rashes  PHYSICAL EXAM: Filed Vitals:   11/25/13 0955  BP: 118/76  Pulse: 76  Resp: 16   General: No acute distress, very flat affect, withdrawn, with poor eye contact Head:  Normocephalic/atraumatic Eyes: Fundoscopic exam shows bilateral sharp discs, no vessel changes, exudates, or hemorrhages Neck: supple, no paraspinal tenderness, full range of motion Back: No paraspinal tenderness Heart: regular rate and rhythm Lungs: Clear to auscultation bilaterally. Vascular: No carotid bruits. Skin/Extremities: No rash, no  edema Neurological Exam: Mental status: alert and oriented to person, place, and time, no dysarthria or aphasia, Fund of knowledge is appropriate.  Recent and remote memory are intact.  Attention and concentration are normal.    Able to name objects and repeat phrases. Cranial nerves: CN I: not tested CN II: pupils equal, round and reactive to light, visual fields intact, fundi unremarkable. CN III, IV, VI:  full range of motion, no nystagmus, no ptosis CN V: facial sensation intact CN VII: upper and lower face symmetric CN VIII: hearing intact to finger rub CN IX, X: gag intact, uvula midline CN XI: sternocleidomastoid and trapezius muscles intact CN XII: tongue midline Bulk & Tone: normal, no fasciculations. Motor: 5/5 throughout with no pronator drift. Sensation: intact to light touch, cold, pin, vibration and joint position sense.  No extinction to double simultaneous stimulation.  Romberg test negative Deep Tendon Reflexes: +2 throughout, no ankle clonus Plantar responses: downgoing bilaterally Cerebellar: no incoordination on finger to nose, heel to shin. No dysdiadochokinesia Gait: narrow-based and steady, able to tandem walk adequately. Tremor: none  IMPRESSION: This is a 18 year old right-handed woman with a history of major depressive disorder, presenting with recurrent staring spells. She also reports possible myoclonic jerks, but also episodes of deja vu, which may represent seizures. MRI brain with and without contrast and routine EEG will be  ordered to assess for focal abnormalities that increase risk for recurrent seizures. If routine EEG is normal, a 24-hour EEG will be ordered to further classify the daily staring episodes. [place] driving laws were discussed with the patient, and she knows to stop driving after an episode of loss of awareness, until 6 months event-free. She will follow-up after the tests.  Thank you for allowing me to participate in the care of this patient.  Please do not hesitate to call for any questions or concerns.   Patrcia DollyKaren Lanise Mergen, M.D.

## 2013-11-26 ENCOUNTER — Encounter: Payer: Self-pay | Admitting: Neurology

## 2013-11-28 ENCOUNTER — Other Ambulatory Visit: Payer: Self-pay

## 2013-11-30 ENCOUNTER — Ambulatory Visit (INDEPENDENT_AMBULATORY_CARE_PROVIDER_SITE_OTHER): Payer: PPO | Admitting: Neurology

## 2013-11-30 DIAGNOSIS — R404 Transient alteration of awareness: Secondary | ICD-10-CM

## 2013-12-01 NOTE — Procedures (Signed)
ELECTROENCEPHALOGRAM REPORT  Date of Study: 11/30/2013  Patient's Name: Debbie King MRN: [id number] Date of Birth: Jul 01, 1995  Referring Provider: Dr. Patrcia DollyKaren Jaymie Misch  Clinical History: This is a 18 year old woman with a history of major depressive disorder, with recurrent staring spells. She also reports possible myoclonic jerks, but also episodes of deja vu  Medications: Tegretol  Technical Summary: A multichannel digital EEG recording measured by the international 10-20 system with electrodes applied with paste and impedances below 5000 ohms performed in our laboratory with EKG monitoring in an awake and asleep patient.  Hyperventilation and photic stimulation were performed.  The digital EEG was referentially recorded, reformatted, and digitally filtered in a variety of bipolar and referential montages for optimal display.    Description: The patient is awake and asleep during the recording.  During maximal wakefulness, there is a symmetric, medium voltage 9 Hz posterior dominant rhythm that attenuates with eye opening.  There is occasional focal 5 Hz theta slowing seen over the left temporal region, at times sharply contoured without clear epileptogenic potential. During drowsiness and sleep, there is an increase in theta slowing of the background.  Vertex waves and symmetric sleep spindles were seen.  Hyperventilation and photic stimulation did not elicit any epileptiform abnormalities, with note of increased occasional theta slowing over the left temporal region.  There were no epileptiform discharges or electrographic seizures seen.    EKG lead was unremarkable.  Impression: This awake and asleep EEG is abnormal due to occasional focal slowing over the left temporal region.  Clinical Correlation of the above findings indicates focal cerebral dysfunction over the left temporal region suggestive of underlying structural or physiologic abnormality. The absence of epileptiform discharges  does not exclude a clinical diagnosis of epilepsy.  If further clinical questions remain, prolonged EEG may be helpful.  Clinical correlation is advised.   Patrcia DollyKaren [name], M.D.

## 2013-12-02 ENCOUNTER — Telehealth: Payer: Self-pay | Admitting: Neurology

## 2013-12-02 NOTE — Telephone Encounter (Signed)
Left VM re: EEG results, would do prolonged EEG as discussed.

## 2013-12-08 ENCOUNTER — Telehealth: Payer: Self-pay | Admitting: Neurology

## 2013-12-08 NOTE — Telephone Encounter (Signed)
Lmom to return my call. 

## 2013-12-08 NOTE — Telephone Encounter (Signed)
Pt needs to talk to someone about MRI she has some questions please call [phone]

## 2013-12-09 NOTE — Telephone Encounter (Signed)
Tried calling patient again. Spoke with patient. She had question if she should have mri scan with contrast instead of just without. I explained to her that Dr. Karel JarvisAquino orders test based on her assessment & patient sxs, she will order contrast testing when she feels it's necessary.

## 2013-12-12 ENCOUNTER — Ambulatory Visit (HOSPITAL_COMMUNITY)
Admission: RE | Admit: 2013-12-12 | Discharge: 2013-12-12 | Disposition: A | Discharge status: Home / Self Care | Payer: PPO | Source: Ambulatory Visit | Attending: Neurology | Admitting: Neurology

## 2013-12-12 DIAGNOSIS — R404 Transient alteration of awareness: Secondary | ICD-10-CM | POA: Insufficient documentation

## 2013-12-12 DIAGNOSIS — F329 Major depressive disorder, single episode, unspecified: Secondary | ICD-10-CM | POA: Insufficient documentation

## 2013-12-12 MED ORDER — GADOBENATE DIMEGLUMINE 529 MG/ML IV SOLN
15.0000 mL | Freq: Once | INTRAVENOUS | Status: AC
  Administered 2013-12-12: 15 mL via INTRAVENOUS

## 2013-12-13 ENCOUNTER — Telehealth: Payer: Self-pay | Admitting: Neurology

## 2013-12-13 DIAGNOSIS — F4323 Adjustment disorder with mixed anxiety and depressed mood: Secondary | ICD-10-CM

## 2013-12-13 DIAGNOSIS — F3131 Bipolar disorder, current episode depressed, mild: Secondary | ICD-10-CM

## 2013-12-13 DIAGNOSIS — F988 Other specified behavioral and emotional disorders with onset usually occurring in childhood and adolescence: Secondary | ICD-10-CM

## 2013-12-13 HISTORY — DX: Adjustment disorder with mixed anxiety and depressed mood: F43.23

## 2013-12-13 HISTORY — DX: Bipolar disorder, current episode depressed, mild: F31.31

## 2013-12-13 HISTORY — DX: Other specified behavioral and emotional disorders with onset usually occurring in childhood and adolescence: F98.8

## 2013-12-13 NOTE — Telephone Encounter (Signed)
Returned call lmom to call me back.

## 2013-12-13 NOTE — Telephone Encounter (Signed)
Pt called to f/u on the results of her MRI she had done yesterday 12/12/13. Please call pt # [phone]

## 2013-12-14 NOTE — Telephone Encounter (Signed)
Called patient. She states that she was calling because she noticed she had a knot on the back of her head. She states that it's not painful & she hasn't hit her head. I did advise if this is something she is concerned about she should call her pcp to have it evaluated.

## 2013-12-26 ENCOUNTER — Encounter: Payer: Self-pay | Admitting: Neurology

## 2013-12-26 ENCOUNTER — Ambulatory Visit (INDEPENDENT_AMBULATORY_CARE_PROVIDER_SITE_OTHER): Payer: PPO | Admitting: Neurology

## 2013-12-26 VITALS — BP 100/80 | HR 72 | Resp 14 | Ht 63.0 in | Wt 131.0 lb

## 2013-12-26 DIAGNOSIS — R404 Transient alteration of awareness: Secondary | ICD-10-CM

## 2013-12-26 NOTE — Progress Notes (Signed)
NEUROLOGY FOLLOW UP OFFICE NOTE  Debbie King [id number]  HISTORY OF PRESENT ILLNESS: I had the pleasure of seeing Debbie King in follow-up in the neurology clinic on 12/26/2013.  The patient was last seen a month ago for recurrent staring spells. She had also reported chronic fatigue. Recently, she has started having trouble breathing or catching her breath, particularly with exertion. She feels cold even when she is in a very hot room. She has been told several times in the ER and by her PCP that her BP is low, and she states she has never had low BP. She keeps hydrated. She feels weak in her extremities, no paresthesias, no falls. She feels she is going to die and feels hopeless because her doctors tell her "it is the depression" but she does not believe this and states that other people have noticed it as well but "my doctors say everything is normal."  She thinks that something is physically wrong because she is taking the medication for depression and still feeling different symptoms. She continues to take Tegretol XR and will be seeing a psychiatrist soon. She sees a therapist but feels it does not help.  Records and images were personally reviewed where available.  I personally reviewed MRI brain with and without contrast which was normal, hippocampi symmetric with no abnormal signal or enhancement. Her routine EEG did not show any epileptiform discharges, there was occasional focal left temporal slowing, which is non-specific. She is scheduled for a 24-hour EEG in 2 weeks to further classify the staring spells.  HPI:  This is a 18 yo RH woman with a history of major depressive disorder who presented for recurrent staring spells. She reports that she is always tired and sleepy, however people around her started noticing that she would stare several times a day. She reports that she had been relatively well until she overdosed on alcohol and Vyvanse in her junior year in high school. She got  better after, then states that she became depressed after she started Mirena a year ago. She felt slow and fatigued. She started her freshman year in college and 4 months ago was told by teachers and students that she would have staring spells. She went to the ER on 09/05/13 because 4 people had told her about it in one day, head CT done was normal. At that time she was also having headaches which have resolved. She notices when the staring is happening, she tries to look down but looks like she is staring at people's shoes. She also reports episodes of deja vu lasting a few minutes. She denies any visual, auditory, olfactory or gustatory hallucinations, but endorses weird dreams and racing thoughts. She noticed she was talking to herself and was admitted to the psychiatric ward from Sept 11-23 for major depression. She was discharged on Lamictal, Zyprexa, Trazodone, and prn Ativan. She had stopped them and started Tegretol in October. She reports Ativan made her back hurt, but did notice less staring spells while on the Ativan.  She reports occasional muscle twitches, mostly her leg would jerk. She reports waking up 3 times in the night with brief shaking, no tongue bite/incontinence. At around age 18 or 25, she would pass out or vomit when she saw a bright light. When she would drink alcohol in the past, she would have body shaking. She reports that her body does not perceive hot or cold, for instance she would wear a jacket in hot weather. She reports taking  Vyvanse in the past for ADD diagnosed in Elementary. She smoked cigarettes, pot, and tried cocaine once at age 18. She had right occipital headaches, described "like a stress ball" where she could not move her head, no associated nausea, vomiting, photo/phonophobia, lasting for weeks at a time.She has been told this is due to depression so she has not been paying attention to it. She denies any dizziness, diplopia, dysarthria/dysphagia, focal  numbness/tingling/weakness, bowel/bladder dysfunction.   She was born of a twin pregnancy, early by 6 weeks. Otherwise had a normal early development. There is no history of febrile convulsions, CNS infections such as meningitis/encephalitis, significant traumatic brain injury, neurosurgical procedures, or family history of seizures (her mother had infertility treatments, she does not know biologic father's history)  PAST MEDICAL HISTORY: Past Medical History  Diagnosis Date  . Current non-smoker but past smoking history unknown   . Depression     MEDICATIONS: Current Outpatient Prescriptions on File Prior to Visit  Medication Sig Dispense Refill  . carbamazepine (TEGRETOL XR) 100 MG 12 hr tablet Take 100 mg by mouth 2 (two) times daily.     No current facility-administered medications on file prior to visit.    ALLERGIES: Allergies  Allergen Reactions  . Citalopram Itching  . Haldol [Haloperidol Lactate]     FAMILY HISTORY: No family history on file.  SOCIAL HISTORY: History   Social History  . Marital Status: Single    Spouse Name: N/A    Number of Children: N/A  . Years of Education: N/A   Occupational History  . Not on file.   Social History Main Topics  . Smoking status: Former Games developermoker  . Smokeless tobacco: Not on file  . Alcohol Use: No  . Drug Use: No     Comment: Former NeurosurgeonUser  . Sexual Activity: Not on file   Other Topics Concern  . Not on file   Social History Narrative   ** Merged History Encounter **        REVIEW OF SYSTEMS: Constitutional: No fevers, chills, or sweats, + generalized fatigue, change in appetite Eyes: No visual changes, double vision, eye pain Ear, nose and throat: No hearing loss, ear pain, nasal congestion, sore throat Cardiovascular: No chest pain, palpitations Respiratory:  + shortness of breath at rest or with exertion,no wheezes GastrointestinaI: No nausea, vomiting, diarrhea, abdominal pain, fecal  incontinence Genitourinary:  No dysuria, urinary retention or frequency Musculoskeletal:  No neck pain, back pain Integumentary: No rash, pruritus, skin lesions Neurological: as above Psychiatric: + depression, insomnia, anxiety Endocrine: No palpitations, +fatigue, no diaphoresis, mood swings, change in appetite, change in weight, increased thirst Hematologic/Lymphatic:  No anemia, purpura, petechiae. Allergic/Immunologic: no itchy/runny eyes, nasal congestion, recent allergic reactions, rashes  PHYSICAL EXAM: Filed Vitals:   12/26/13 0836  BP: 100/80  Pulse: 72  Resp: 14   General: No acute distress, flat affect, poor eye contact Head:  Normocephalic/atraumatic Neck: supple, no paraspinal tenderness, full range of motion Heart:  Regular rate and rhythm Lungs:  Clear to auscultation bilaterally Back: No paraspinal tenderness Skin/Extremities: No rash, no edema Neurological Exam: alert and oriented to person, place, and time. No aphasia or dysarthria. Fund of knowledge is appropriate.  Recent and remote memory are intact.  Attention and concentration are normal.    Able to name objects and repeat phrases. Cranial nerves: Pupils equal, round, reactive to light.  Fundoscopic exam unremarkable, no papilledema. Extraocular movements intact with no nystagmus. Visual fields full. Facial sensation intact. No facial  asymmetry. Tongue, uvula, palate midline.  Motor: Bulk and tone normal, muscle strength 5/5 throughout with no pronator drift.  Sensation to light touch intact.  No extinction to double simultaneous stimulation.  Deep tendon reflexes 2+ throughout, toes downgoing.  Finger to nose testing intact.  Gait narrow-based and steady, able to tandem walk adequately.  Romberg negative.  IMPRESSION: This is a 18 yo RH woman with a history of major depressive disorder, with recurrent staring spells. She also reports possible myoclonic jerks, but also episodes of deja vu, which may represent  seizures. MRI brain normal, routine EEG had shown occasional left temporal slowing, which is non-specific, no epileptiform discharges. I would recommend proceeding with the 24-hour EEG to further classify the staring spells and assess for seizures. She reports feeling hopeless because the "doctors are not helping" her and attribute all her symptoms to depression. I feel that certainly the mood issues can cause the physical symptoms she is having, however would rule out a neurological cause of the staring spells. I discussed continuing care with her therapist and psychiatrist as scheduled, and discuss her symptoms with them as well. She will speak to her PCP regarding the shortness of breath. She will follow-up after the EEG.   Thank you for allowing me to participate in her care.  Please do not hesitate to call for any questions or concerns.  The duration of this appointment visit was 15 minutes of face-to-face time with the patient.  Greater than 50% of this time was spent in counseling, explanation of diagnosis, planning of further management, and coordination of care.   Patrcia DollyKaren Aquino, M.D.   CC: Dr. Katrinka BlazingSmith

## 2013-12-26 NOTE — Patient Instructions (Signed)
1. Proceed with 24-hour EEG as scheduled 2. Continue your medication  3. Call your PCP regarding the shortness of breath 4. Follow-up with psychiatrist as scheduled 5. Follow-up after 24-hour EEG

## 2013-12-26 NOTE — Progress Notes (Signed)
Done

## 2014-01-10 ENCOUNTER — Telehealth: Payer: Self-pay | Admitting: Neurology

## 2014-01-10 NOTE — Telephone Encounter (Signed)
Pt cancelled the 24 hour EEG for 01-11-14

## 2014-01-11 ENCOUNTER — Other Ambulatory Visit: Payer: Self-pay

## 2014-01-13 ENCOUNTER — Ambulatory Visit (INDEPENDENT_AMBULATORY_CARE_PROVIDER_SITE_OTHER): Payer: PPO | Admitting: Physician Assistant

## 2014-01-13 ENCOUNTER — Encounter (HOSPITAL_COMMUNITY): Payer: Self-pay | Admitting: Physician Assistant

## 2014-01-13 VITALS — BP 105/62 | HR 88 | Ht 63.0 in | Wt 119.0 lb

## 2014-01-13 DIAGNOSIS — F411 Generalized anxiety disorder: Secondary | ICD-10-CM

## 2014-01-13 DIAGNOSIS — R4689 Other symptoms and signs involving appearance and behavior: Secondary | ICD-10-CM

## 2014-01-13 DIAGNOSIS — F251 Schizoaffective disorder, depressive type: Secondary | ICD-10-CM

## 2014-01-13 DIAGNOSIS — Z9114 Patient's other noncompliance with medication regimen: Secondary | ICD-10-CM

## 2014-01-13 DIAGNOSIS — F250 Schizoaffective disorder, bipolar type: Secondary | ICD-10-CM

## 2014-01-13 MED ORDER — QUETIAPINE FUMARATE 25 MG PO TABS: ORAL_TABLET | ORAL | Status: DC

## 2014-01-13 MED ORDER — QUETIAPINE FUMARATE 100 MG PO TABS: 100.0000 mg | ORAL_TABLET | Freq: Every day | ORAL | Status: DC

## 2014-01-13 NOTE — Progress Notes (Signed)
Psychiatric Assessment Adult  Patient Identification:  Debbie King Date of Evaluation:  01/13/2014 Chief Complaint: GAD History of Chief Complaint:   Chief Complaint  Patient presents with  . Anxiety    HPI Comments: Patient has a history of Bipolar disorder Schizoaffective type, and reports 2 hospital [facility] since September. [facility] [place], and [facility].  She has not been compliant about follow up or taking her medication.  Today she presents for evaluation and assessment to establish care here. She reports her primary symptom is anxiety and racing thoughts. She denies Bipolar disorder, and does not feel that she has that. She denies AVH, SI or HI.  She denies previous AVH. She reluctantly agrees that she has had SI in the past but has never acted on this. She notes that her problem is severe anxiety and racing thoughts.  Anxiety Symptoms include nervous/anxious behavior. Patient reports no chest pain, decreased concentration, nausea, palpitations or shortness of breath.     Review of Systems  Constitutional: Positive for chills. Negative for fever and diaphoresis.  HENT: Positive for congestion, postnasal drip, sinus pressure and sore throat.   Eyes: Negative.   Respiratory: Negative.  Negative for cough, choking and shortness of breath.   Cardiovascular: Negative for chest pain and palpitations.  Gastrointestinal: Negative for nausea, vomiting, abdominal pain, diarrhea and constipation.  Endocrine: Positive for cold intolerance.  Genitourinary: Negative for dysuria, vaginal discharge, difficulty urinating, pelvic pain and dyspareunia.  Musculoskeletal: Positive for back pain.  Skin: Positive for rash (acne).  Allergic/Immunologic: Negative.   Neurological: Negative.   Psychiatric/Behavioral: Negative for hallucinations, dysphoric mood and decreased concentration. The patient is nervous/anxious. The patient is not hyperactive.    Physical Exam   Constitutional: She appears lethargic.  Neurological: She appears lethargic. Coordination and gait normal.  Rhomberg negative Normal 1 foot standing each foot >10 seconds     Depressive Symptoms: denies  (Hypo) Manic Symptoms:   Elevated Mood:  denies Irritable Mood:  denies Grandiosity:  denies Distractibility:  denies Labiality of Mood:  No Delusions:  No Hallucinations:  No Impulsivity:  No Sexually Inappropriate Behavior:  No Financial Extravagance:  No Flight of Ideas:  No  Anxiety Symptoms: Excessive Worry:  Yes Panic Symptoms:  Yes Agoraphobia:  No Obsessive Compulsive: No  Symptoms: None, Specific Phobias:  No Social Anxiety:  Yes  Psychotic Symptoms:  Hallucinations: No  Delusions:  No Paranoia:  No   Ideas of Reference:  No  PTSD Symptoms: Ever had a traumatic exposure:  No Had a traumatic exposure in the last month:  No Re-experiencing: No None Hypervigilance:  No Hyperarousal:   Avoidance:    Traumatic Brain Injury: No   Past Psychiatric History: Diagnosis: MDD, bipolar disorder, Schizoaffective   Hospitalizations: x2  Outpatient Care: [facility]  Substance Abuse Care: denies  Self-Mutilation: x1 "scratched myself "  Suicidal Attempts: denies  Violent Behaviors: denies   Past Medical History:   Past Medical History  Diagnosis Date  . Current non-smoker but past smoking history unknown   . Depression   . Anxiety   . Bipolar disorder    History of Loss of Consciousness:  No Seizure History:  No Cardiac History:  No Allergies:   Allergies  Allergen Reactions  . Citalopram Itching  . Haldol [Haloperidol Lactate]    Current Medications:  No current outpatient prescriptions on file.   No current facility-administered medications for this visit.    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 Legal History: Resisting arrest Hobbies/Interests:   Family History:  No family history on file.  Mental Status Examination/Evaluation: Objective:  Appearance: casual dress, sedated  Eye Contact::  Poor  Speech:  Slow and Slurred  Volume:  Normal  Mood:  depressed  Affect:  Flat  Thought Process:  Coherent  Orientation:  Full (Time, Place, and Person)  Thought Content:  WDL  Suicidal Thoughts:  No  Homicidal Thoughts:  No  Judgement:  Poor  Insight:  Shallow  Psychomotor Activity:  Decreased  Akathisia:  No  Handed:  Right  AIMS (if indicated):    Assets:  Housing    Laboratory/X-Ray Psychological Evaluation(s)        Assessment:    AXIS I  Schizoaffective disorder bipolar type, GAD, Non compliant  AXIS II Deferred  AXIS III Past Medical History  Diagnosis Date  . Current non-smoker but past smoking history unknown   . Depression   . Anxiety   . Bipolar disorder      AXIS IV educational problems and occupational problems  AXIS V 61-70 mild symptoms   Treatment Plan/Recommendations:  Plan of Care: medication management  Laboratory:  UDS  Psychotherapy: with Danelle EarthlyLynn Murry  Medications: Seroquel  Routine PRN Medications:  No  Consultations: as needed  Safety Concerns:  None at this time  Other:      Jsoeph Podesta, PA-C 12/11/201511:28 AM

## 2014-01-13 NOTE — Patient Instructions (Signed)
1. Patient is made clear that she must take medications as directed or she will be  Discharged from the practice. 2. She must keep all appointments. 3. Patient is made aware that medications changes are not made over the phone. 4. She is made aware that she must call for questions or problems and that an office visit may be required to make changes in her medication. 5. She must sign a release of information from her therapist for transparency.

## 2014-01-30 ENCOUNTER — Telehealth: Payer: Self-pay | Admitting: *Deleted

## 2014-01-30 NOTE — Telephone Encounter (Signed)
Noted  

## 2014-01-30 NOTE — Telephone Encounter (Signed)
Patient canceled her follow up appointment for 01/31/2014 through the answering service

## 2014-01-31 ENCOUNTER — Ambulatory Visit (HOSPITAL_COMMUNITY): Payer: Self-pay | Admitting: Physician Assistant

## 2014-01-31 ENCOUNTER — Ambulatory Visit: Payer: Self-pay | Admitting: Neurology

## 2014-10-13 ENCOUNTER — Encounter (HOSPITAL_COMMUNITY): Payer: Self-pay

## 2014-10-13 ENCOUNTER — Emergency Department (HOSPITAL_COMMUNITY)
Admission: EM | Admit: 2014-10-13 | Discharge: 2014-10-13 | Discharge status: Against Medical Advice | Payer: PPO | Attending: Emergency Medicine | Admitting: Emergency Medicine

## 2014-10-13 DIAGNOSIS — R51 Headache: Secondary | ICD-10-CM | POA: Insufficient documentation

## 2014-10-13 DIAGNOSIS — R079 Chest pain, unspecified: Secondary | ICD-10-CM | POA: Insufficient documentation

## 2014-10-13 NOTE — ED Notes (Signed)
Called for pt 1x, no response 

## 2014-10-13 NOTE — ED Notes (Addendum)
Pt with headache and chest discomfort since Tuesday.  Pt under stress with school.  States she hasn't felt well for a while.  Pt denies respiratory symptoms or cardiac history.  Denies fever.  Some shortness of breath with exertion. Pt also very flat in assessment/speech.  Pt states "people think she is looking at them and she is not".  Pt has hx of depression/anxiety.  Not taking meds.

## 2014-10-13 NOTE — ED Notes (Signed)
Called for pt 2nd time

## 2014-12-20 ENCOUNTER — Telehealth: Payer: Self-pay | Admitting: Neurology

## 2014-12-20 NOTE — Telephone Encounter (Signed)
I spoke with patient she states she is still having the episodes of "starring at people" and she would like to know what is "wrong" with her. She wasn't able to do the 24 hour eeg or f/u office visit last year due to losing her insurance. She has now applied for the [employer] Assistance Program.   I spoke with Dr. Karel JarvisAquino about this and advisement was if she gets approved for financial assistance to go ahead and proceed with the 24 hour eeg and then come in for f/u visit once the test is completed.   I called patient back and spoke with her about this. I told her to call us when she gets her approval letter and we will get those appts scheduled for her.

## 2014-12-20 NOTE — Telephone Encounter (Signed)
Lmovm to rtn my call. 

## 2014-12-20 NOTE — Telephone Encounter (Signed)
Pt stopped by front desk to report that she has been moments of slow movements/ call back @ [phone]

## 2015-02-01 ENCOUNTER — Encounter (HOSPITAL_COMMUNITY): Payer: Self-pay | Admitting: Emergency Medicine

## 2015-02-01 ENCOUNTER — Emergency Department (HOSPITAL_COMMUNITY)
Admission: EM | Admit: 2015-02-01 | Discharge: 2015-02-01 | Disposition: A | Discharge status: Home / Self Care | Payer: PPO | Attending: Emergency Medicine | Admitting: Emergency Medicine

## 2015-02-01 DIAGNOSIS — F319 Bipolar disorder, unspecified: Secondary | ICD-10-CM | POA: Insufficient documentation

## 2015-02-01 DIAGNOSIS — Z3202 Encounter for pregnancy test, result negative: Secondary | ICD-10-CM | POA: Insufficient documentation

## 2015-02-01 DIAGNOSIS — R253 Fasciculation: Secondary | ICD-10-CM

## 2015-02-01 DIAGNOSIS — Z87891 Personal history of nicotine dependence: Secondary | ICD-10-CM | POA: Insufficient documentation

## 2015-02-01 LAB — I-STAT CHEM 8, ED
BUN: 11 mg/dL (ref 6–20)
CHLORIDE: 103 mmol/L (ref 101–111)
CREATININE: 0.7 mg/dL (ref 0.44–1.00)
Calcium, Ion: 1.17 mmol/L (ref 1.12–1.23)
GLUCOSE: 88 mg/dL (ref 65–99)
HCT: 44 % (ref 36.0–46.0)
Hemoglobin: 15 g/dL (ref 12.0–15.0)
POTASSIUM: 4 mmol/L (ref 3.5–5.1)
Sodium: 141 mmol/L (ref 135–145)
TCO2: 26 mmol/L (ref 0–100)

## 2015-02-01 LAB — I-STAT BETA HCG BLOOD, ED (MC, WL, AP ONLY)

## 2015-02-01 NOTE — ED Provider Notes (Signed)
CSN: [id number]     Arrival date & time 02/01/15  0932 History   First MD Initiated Contact with Patient 02/01/15 1056     Chief Complaint  Patient presents with  . "Flutter" in Abdomen     HPI   19 year old female presents today with muscle spasm. Patient reports that over the last month she's had intermittent muscle twitching on the anterior inferior portion of her right ribs. She reports that she can feel the muscle twitching underneath her finger, lasts several seconds and then resolve spontaneously. She reports that she also has muscle twitching and different areas of her body. She denies any pain, chest pain, shortness of breath, decreased by mouth intake, rash, trauma, or any other concerning signs or symptoms. Patient has not tried anything for the spasms.   Past Medical History  Diagnosis Date  . Current non-smoker but past smoking history unknown   . Depression   . Anxiety   . Bipolar disorder [facility](HCC)    Past Surgical History  Procedure Laterality Date  . Tonsillectomy    . Wisdom tooth extraction     History reviewed. No pertinent family history. Social History  Substance Use Topics  . Smoking status: Former Games developermoker  . Smokeless tobacco: Never Used  . Alcohol Use: No   OB History    No data available     Review of Systems  All other systems reviewed and are negative.   Allergies  Citalopram and Haldol  Home Medications   Prior to Admission medications   Medication Sig Start Date End Date Taking? Authorizing Provider  QUEtiapine (SEROQUEL) 100 MG tablet Take 1 tablet (100 mg total) by mouth at bedtime. Patient not taking: Reported on 02/01/2015 01/13/14   Tamala JulianNeil T Mashburn, PA-C  QUEtiapine (SEROQUEL) 25 MG tablet Take one tablet each morning for anxiety. Patient not taking: Reported on 02/01/2015 01/13/14   Rona RavensNeil T Mashburn, PA-C   BP 104/73 mmHg  Pulse 86  Temp(Src) 98.9 F (37.2 C) (Oral)  Resp 15  SpO2 100%   Physical Exam  Constitutional: She is  oriented to person, place, and time. She appears well-developed and well-nourished.  HENT:  Head: Normocephalic and atraumatic.  Eyes: Conjunctivae are normal. Pupils are equal, round, and reactive to light. Right eye exhibits no discharge. Left eye exhibits no discharge. No scleral icterus.  Neck: Normal range of motion. No JVD present. No tracheal deviation present.  Pulmonary/Chest: Effort normal. No stridor.  H medic, nontender to palpation, no active muscle spasm or fasciculations  Neurological: She is alert and oriented to person, place, and time. Coordination normal.  Psychiatric: She has a normal mood and affect. Her behavior is normal. Judgment and thought content normal.  Nursing note and vitals reviewed.   ED Course  Procedures (including critical care time) Labs Review Labs Reviewed  I-STAT CHEM 8, ED  I-STAT BETA HCG BLOOD, ED ([facility], [facility], [facility] ONLY)    Imaging Review No results found. I have personally reviewed and evaluated these images and lab results as part of my medical decision-making.   EKG Interpretation   Date/Time:  Thursday February 01 2015 10:05:34 EST Ventricular Rate:  88 PR Interval:  107 QRS Duration: 84 QT Interval:  363 QTC Calculation: 439 R Axis:   71 Text Interpretation:  Sinus rhythm Short PR interval Low voltage,  precordial leads No previous tracing Confirmed by BEATON  MD, ROBERT  ([id number]) on 02/01/2015 10:57:43 AM      MDM   Final diagnoses:  Fasciculations of muscle     Labs: I-STAT Chem-8, i-STAT beta hCG  Imaging:  Consults:  Therapeutics:  Discharge Meds:   Assessment/Plan: Patient presentation most consistent with muscular fasciculations. Patient has no pain, no skin findings chemistry panel. Patient will be instructed to follow-up with primary care for further evaluation and management. She is given strict return precautions, verbalized understanding and agreement for today's plan and had no further questions or  concerns         Eyvonne Mechanic, PA-C 02/02/15 1610  Nelva Nay, MD 02/10/15 [phone]

## 2015-02-01 NOTE — ED Notes (Signed)
Pt c/o intermittent "flutter" in epigastric region onset 01-02-15. Denies pain or dizziness. No aggravating or alleviating factors.

## 2015-02-01 NOTE — Discharge Instructions (Signed)
Please read attached information. If you experience any new or worsening signs or symptoms please return to the emergency room for evaluation. Please follow-up with your primary care provider or specialist as discussed. Please use medication prescribed only as directed and discontinue taking if you have any concerning signs or symptoms.   °

## 2016-09-02 IMAGING — MR MR HEAD WO/W CM
10 of 13 series · 32 of 48 positions shown · IV contrast (Yes   MULTIHANCE)
Comparison: None.

CLINICAL DATA: Major depressive disorder, with recurrent staring
spells. Initial encounter. Possible myoclonic jerks.

EXAM:
MRI HEAD WITHOUT AND WITH CONTRAST
TECHNIQUE: Multiplanar, multiecho pulse sequences of the brain and surrounding
structures were obtained without and with intravenous contrast.
CONTRAST:  15mL MULTIHANCE GADOBENATE DIMEGLUMINE 529 MG/ML IV SOLN

[Series 3: T1 · sagittal · 5.0mm · 0.47mm/px · 3 of 25 slices shown]
[im 1/25]
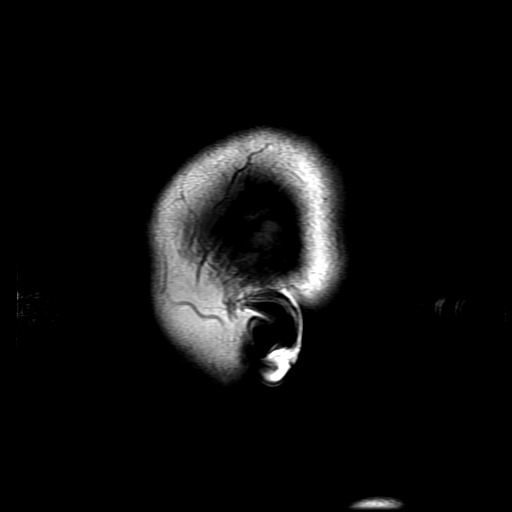
[im 13/25]
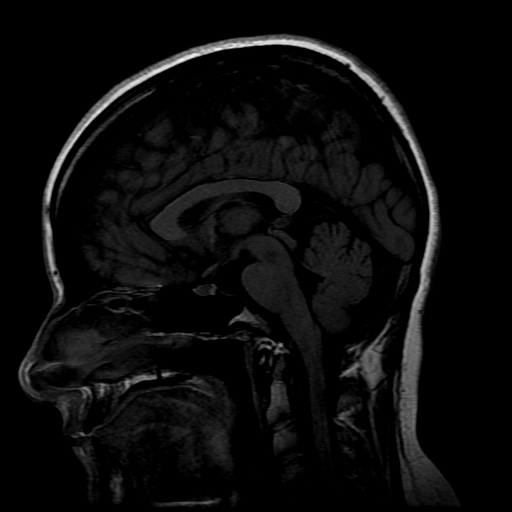
[im 25/25]
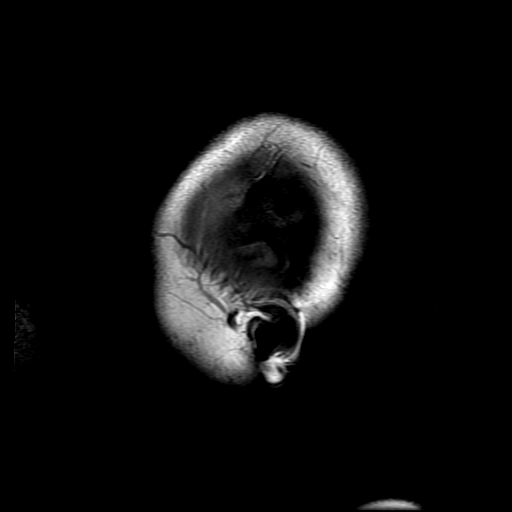

[Series 4: DWI · axial · 5.0mm · 1.09mm/px · z∈[-64,+85]mm · 7 of 62 slices shown (1 of 4)]
[im 1/62]
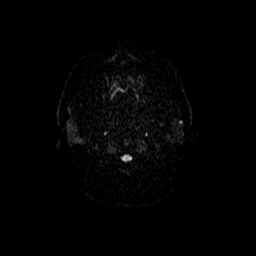
[im 11/62]
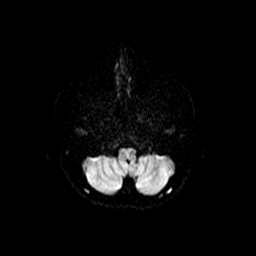
[im 21/62]
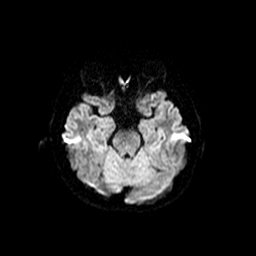
[im 31/62]
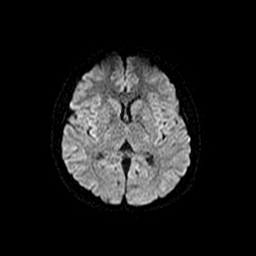
[im 41/62]
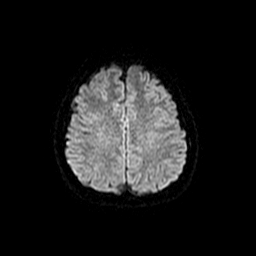
[im 51/62]
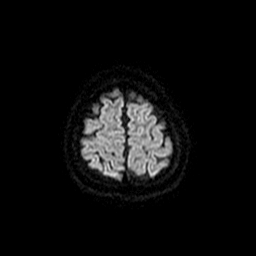
[im 62/62]
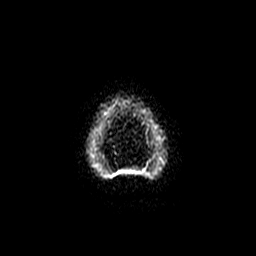

[Series 5: T2 · axial · 5.0mm · 0.43mm/px · z∈[-58,+90]mm · 2 of 26 slices shown]
[im 1/26]
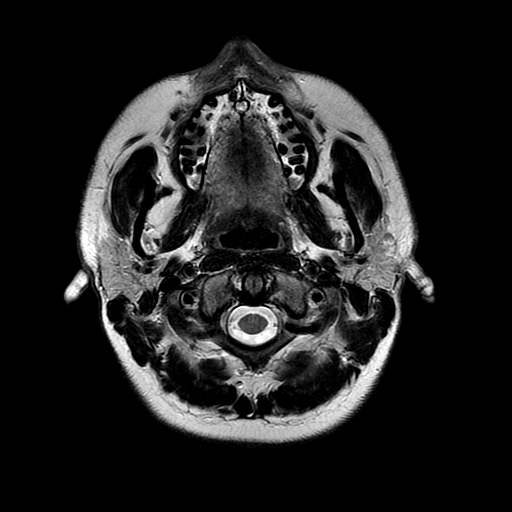
[im 26/26]
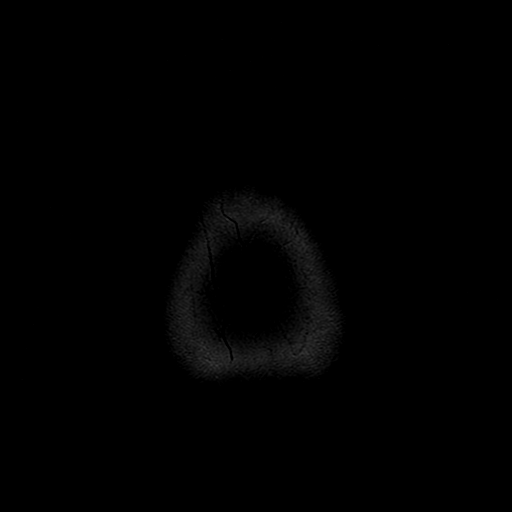

[Series 6: DWI · coronal · 5.0mm · 1.09mm/px · 6 of 66 slices shown (2 of 4)]
[im 1/66]
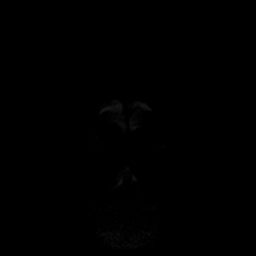
[im 14/66]
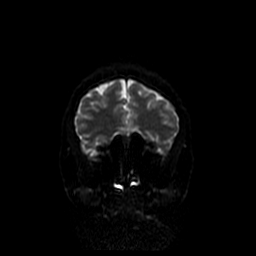
[im 27/66]
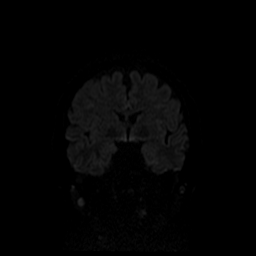
[im 40/66]
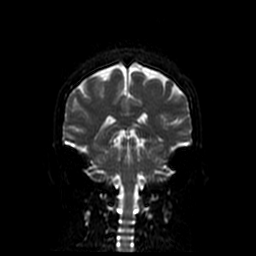
[im 53/66]
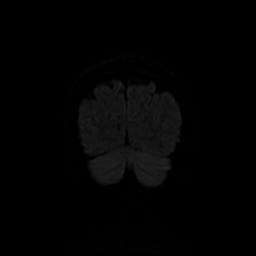
[im 66/66]
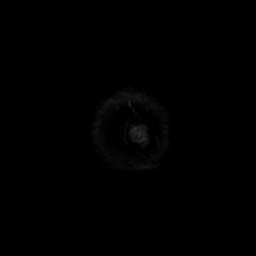

[Series 7: FLAIR · axial · 5.0mm · 0.43mm/px · z∈[-58,+90]mm · 2 of 26 slices shown]
[im 1/26]
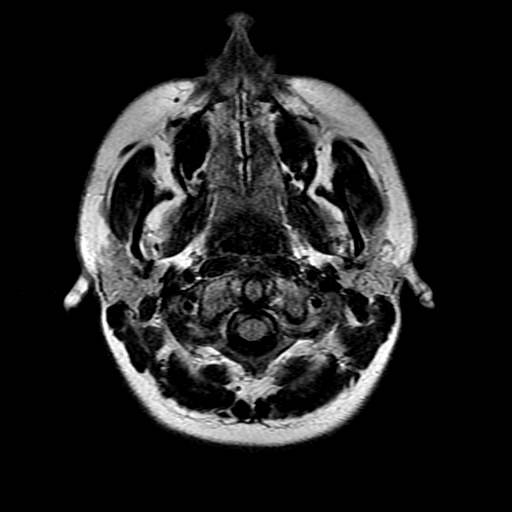
[im 26/26]
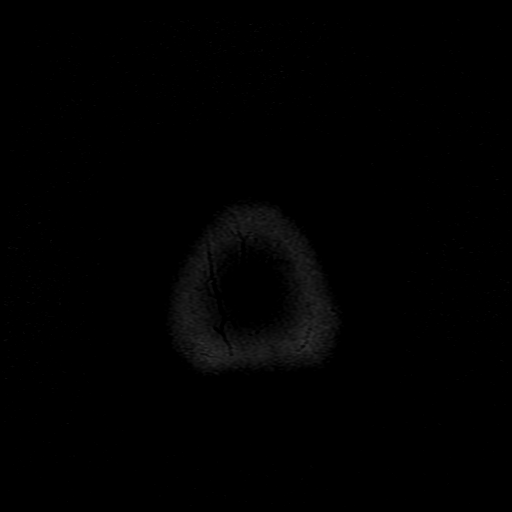

[Series 10: T2 post-contrast · coronal · 5.0mm · 0.39mm/px · 2 of 25 slices shown]
[im 1/25]
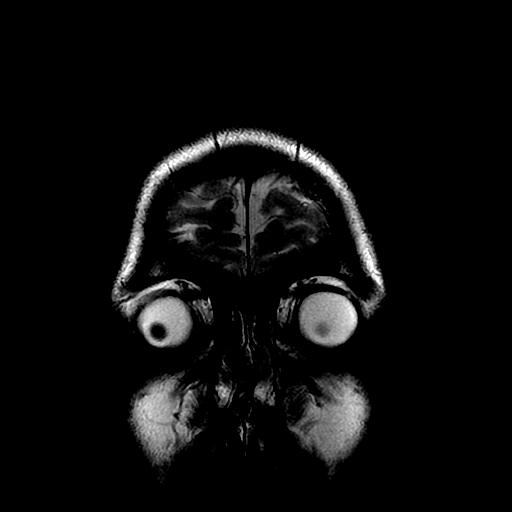
[im 25/25]
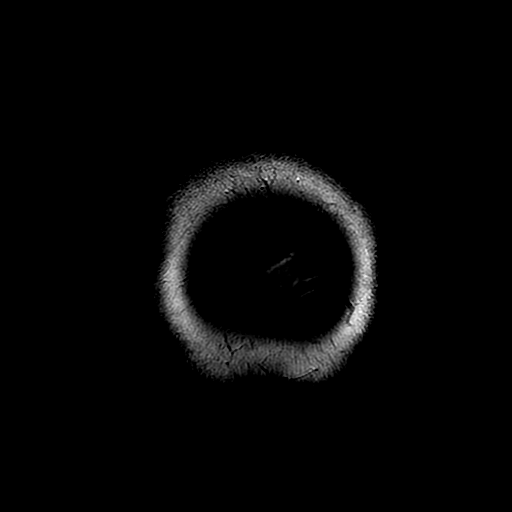

[Series 13: T1 post-contrast · coronal · 5.0mm · 0.43mm/px · 2 of 25 slices shown (1 of 2)]
[im 1/25]
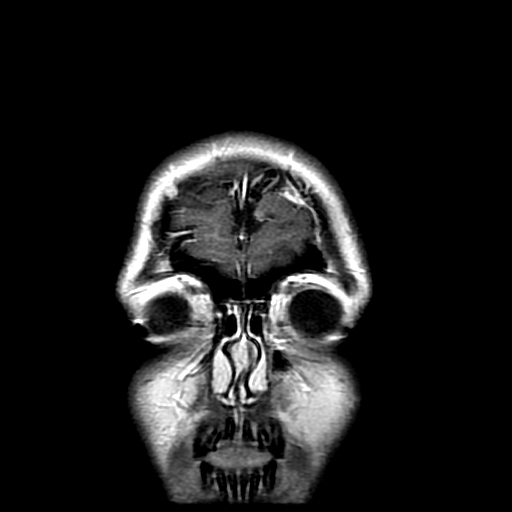
[im 25/25]
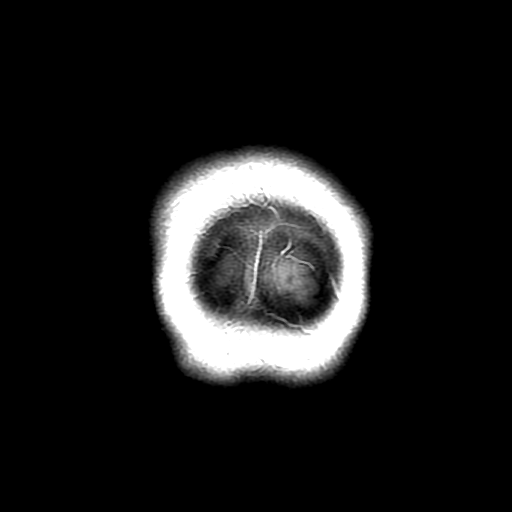

[Series 14: T1 post-contrast · sagittal · 5.0mm · 0.47mm/px · 2 of 25 slices shown (2 of 2)]
[im 1/25]
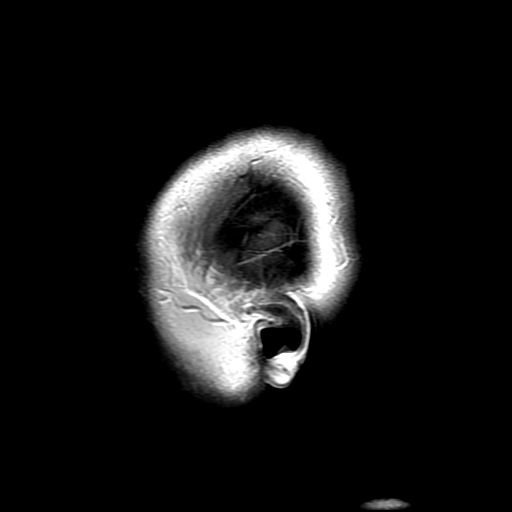
[im 25/25]
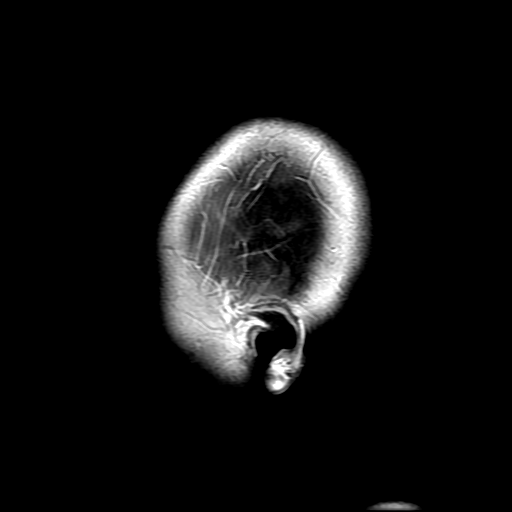

[Series 400: DWI · axial · 5.0mm · 1.09mm/px · z∈[-64,+85]mm · 3 of 31 slices shown (3 of 4)]
[im 1/31]
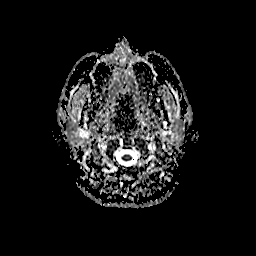
[im 16/31]
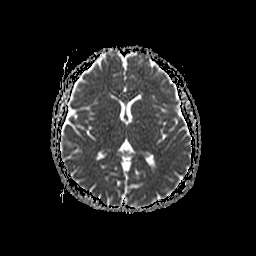
[im 31/31]
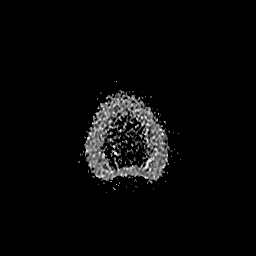

[Series 600: DWI · coronal · 5.0mm · 1.09mm/px · 3 of 33 slices shown (4 of 4)]
[im 1/33]
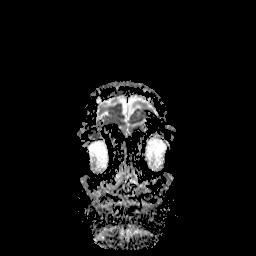
[im 17/33]
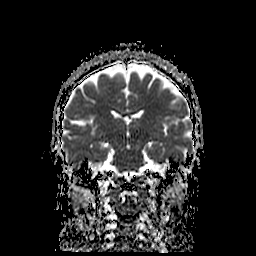
[im 33/33]
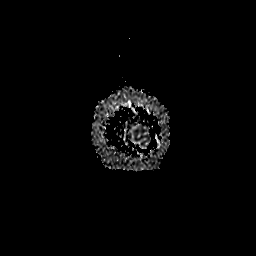

[32 of 48 positions shown; findings below may reference images not displayed]

FINDINGS: No evidence for acute infarction, hemorrhage, mass lesion,
hydrocephalus, or extra-axial fluid. Normal cerebral volume. No
white matter disease. Flow voids are maintained throughout the
carotid, basilar, and vertebral arteries. There are no areas of
chronic hemorrhage. Pituitary, pineal, and cerebellar tonsils
unremarkable. No upper cervical lesions. Visualized calvarium, skull
base, and upper cervical osseous structures unremarkable. Scalp and
extracranial soft tissues, orbits, sinuses, and mastoids show no
acute process.

Post infusion, no abnormal enhancement of the brain or meninges.
Major dural venous sinuses are patent.
IMPRESSION: Negative exam. No acute or focal intracranial abnormality. No
structural abnormality of the brain is evident.

## 2017-03-25 ENCOUNTER — Encounter (HOSPITAL_COMMUNITY): Payer: Self-pay | Admitting: *Deleted

## 2017-03-25 DIAGNOSIS — Z5320 Procedure and treatment not carried out because of patient's decision for unspecified reasons: Secondary | ICD-10-CM | POA: Insufficient documentation

## 2017-03-25 DIAGNOSIS — G8929 Other chronic pain: Secondary | ICD-10-CM | POA: Insufficient documentation

## 2017-03-25 DIAGNOSIS — R5381 Other malaise: Secondary | ICD-10-CM | POA: Diagnosis not present

## 2017-03-25 DIAGNOSIS — M5382 Other specified dorsopathies, cervical region: Secondary | ICD-10-CM | POA: Insufficient documentation

## 2017-03-25 DIAGNOSIS — Z87891 Personal history of nicotine dependence: Secondary | ICD-10-CM | POA: Diagnosis not present

## 2017-03-25 DIAGNOSIS — R102 Pelvic and perineal pain: Secondary | ICD-10-CM | POA: Insufficient documentation

## 2017-03-25 NOTE — ED Triage Notes (Signed)
Pt states intermittent lower abdominal pain since she had IUD placed 5 years ago (removed 3 years ago). Pt states she is "just super paranoid and make sure my uterus is Novella Robokay"and is concerned that something is wrong because the IUD was in place. She denies vaginal d/c or bleeding.

## 2017-03-26 ENCOUNTER — Emergency Department (HOSPITAL_COMMUNITY)
Admission: EM | Admit: 2017-03-26 | Discharge: 2017-03-26 | Discharge status: Against Medical Advice | Payer: Medicaid Other | Attending: Emergency Medicine | Admitting: Emergency Medicine

## 2017-03-26 DIAGNOSIS — R5381 Other malaise: Secondary | ICD-10-CM

## 2017-03-26 DIAGNOSIS — R5383 Other fatigue: Secondary | ICD-10-CM

## 2017-03-26 LAB — GC/CHLAMYDIA PROBE AMP (~~LOC~~) NOT AT ARMC
CHLAMYDIA, DNA PROBE: NEGATIVE
Neisseria Gonorrhea: NEGATIVE

## 2017-03-26 LAB — WET PREP, GENITAL
Sperm: NONE SEEN
TRICH WET PREP: NONE SEEN
YEAST WET PREP: NONE SEEN

## 2017-03-26 LAB — URINALYSIS, ROUTINE W REFLEX MICROSCOPIC
Bacteria, UA: NONE SEEN
Bilirubin Urine: NEGATIVE
Glucose, UA: NEGATIVE mg/dL
Hgb urine dipstick: NEGATIVE
KETONES UR: NEGATIVE mg/dL
Nitrite: NEGATIVE
PH: 7 (ref 5.0–8.0)
Protein, ur: NEGATIVE mg/dL
Specific Gravity, Urine: 1.014 (ref 1.005–1.030)

## 2017-03-26 LAB — POC URINE PREG, ED: Preg Test, Ur: NEGATIVE

## 2017-03-26 NOTE — ED Notes (Signed)
Pt was seen walking down the hallway from her room with her street clothes on. This Rn stopped pt and the pt stated that she didn't want any other tests done and she was fine. This RN asked if the pt would stay and talk with the MD and she stated no and she wanted to leave. Pt was CAOx4 and ambulatory.

## 2017-03-26 NOTE — ED Provider Notes (Signed)
[facility]-EMERGENCY DEPT Provider Note: Lowella Dell, MD, FACEP  CSN: [id number] MRN: [id number] ARRIVAL: 03/25/17 at 2256 ROOM: WA21/WA21   CHIEF COMPLAINT  Abdominal Pain   HISTORY OF PRESENT ILLNESS  03/26/17 1:46 AM Henriette Hesser is a 22 y.o. female who had an IUD placed 5 years ago.  Because of bleeding and pelvic pain she had it removed 3 years ago.  Despite this she has had persistent chronic fatigue, poor appetite and insomnia.  She has also had intermittent pelvic pain which she currently rates as a 5 out of 10.  She states she is "super paranoid and want to make sure my uterus is okay".  She does not relate an acute change in her symptomatology, more a change in her worry.  She denies dysuria, nausea, vomiting, diarrhea, vaginal bleeding or vaginal discharge.  She denies suicidal ideation.  Past Medical History:  Diagnosis Date  . Anxiety   . Bipolar disorder (HCC)   . Current non-smoker but past smoking history unknown   . Depression     Past Surgical History:  Procedure Laterality Date  . TONSILLECTOMY    . WISDOM TOOTH EXTRACTION      No family history on file.  Social History   Tobacco Use  . Smoking status: Former Games developer  . Smokeless tobacco: Never Used  Substance Use Topics  . Alcohol use: No    Alcohol/week: 0.0 oz  . Drug use: No    Comment: Former Neurosurgeon    Prior to Admission medications   Medication Sig Start Date End Date Taking? Authorizing Provider  QUEtiapine (SEROQUEL) 100 MG tablet Take 1 tablet (100 mg total) by mouth at bedtime. Patient not taking: Reported on 02/01/2015 01/13/14   Verne Spurr T, PA-C  QUEtiapine (SEROQUEL) 25 MG tablet Take one tablet each morning for anxiety. Patient not taking: Reported on 02/01/2015 01/13/14   Verne Spurr T, PA-C    Allergies Citalopram and Haldol [haloperidol lactate]   REVIEW OF SYSTEMS  Negative except as noted here or in the History of Present Illness.   PHYSICAL EXAMINATION  Initial  Vital Signs Blood pressure 116/73, pulse 75, temperature 98.1 F (36.7 C), temperature source Oral, resp. rate 18, SpO2 99 %.  Examination General: Well-developed, well-nourished female in no acute distress; appearance consistent with age of record HENT: normocephalic; atraumatic Eyes: pupils equal, round and reactive to light; extraocular muscles intact Neck: supple Heart: regular rate and rhythm Lungs: clear to auscultation bilaterally Abdomen: soft; nondistended; nontender; no masses or hepatosplenomegaly; bowel sounds present GU: Normal external genitalia; no vaginal bleeding; no vaginal discharge; no cervical motion tenderness; no adnexal tenderness Extremities: No deformity; full range of motion; pulses normal Neurologic: Awake, alert and oriented; motor function intact in all extremities and symmetric; no facial droop Skin: Warm and dry Psychiatric: Flat affect; no HI or SI   RESULTS  Summary of this visit's results, reviewed by myself:   EKG Interpretation  Date/Time:    Ventricular Rate:    PR Interval:    QRS Duration:   QT Interval:    QTC Calculation:   R Axis:     Text Interpretation:        Laboratory Studies: Results for orders placed or performed during the hospital encounter of 03/26/17 (from the past 24 hour(s))  Urinalysis, Routine w reflex microscopic     Status: Abnormal   Collection Time: 03/26/17  1:39 AM  Result Value Ref Range   Color, Urine YELLOW YELLOW   APPearance  CLOUDY (A) CLEAR   Specific Gravity, Urine 1.014 1.005 - 1.030   pH 7.0 5.0 - 8.0   Glucose, UA NEGATIVE NEGATIVE mg/dL   Hgb urine dipstick NEGATIVE NEGATIVE   Bilirubin Urine NEGATIVE NEGATIVE   Ketones, ur NEGATIVE NEGATIVE mg/dL   Protein, ur NEGATIVE NEGATIVE mg/dL   Nitrite NEGATIVE NEGATIVE   Leukocytes, UA TRACE (A) NEGATIVE   RBC / HPF 0-5 0 - 5 RBC/hpf   WBC, UA 0-5 0 - 5 WBC/hpf   Bacteria, UA NONE SEEN NONE SEEN   Squamous Epithelial / LPF 0-5 (A) NONE SEEN    Mucus PRESENT    Amorphous Crystal PRESENT   POC Urine Pregnancy, ED ([facility])     Status: None   Collection Time: 03/26/17  1:51 AM  Result Value Ref Range   Preg Test, Ur NEGATIVE NEGATIVE  Wet prep, genital     Status: Abnormal   Collection Time: 03/26/17  1:55 AM  Result Value Ref Range   Yeast Wet Prep HPF POC NONE SEEN NONE SEEN   Trich, Wet Prep NONE SEEN NONE SEEN   Clue Cells Wet Prep HPF POC PRESENT (A) NONE SEEN   WBC, Wet Prep HPF POC FEW (A) NONE SEEN   Sperm NONE SEEN    Imaging Studies: No results found.  ED COURSE  Nursing notes and initial vitals signs, including pulse oximetry, reviewed.  Vitals:   03/25/17 2330 03/26/17 0142  BP: 109/78 116/73  Pulse: 77 75  Resp: 18 18  Temp: 98.1 F (36.7 C)   TempSrc: Oral   SpO2: 98% 99%   2:59 AM Patient eloped, refusing to speak to myself prior to leaving.  She did not wait to have her blood work drawn.  As her symptoms have been present for years I doubt significant acute pathology.  She does have a psychiatric history and depression may be contributing.  She did deny suicidal ideation.  PROCEDURES    ED DIAGNOSES     ICD-10-CM   1. Malaise and fatigue R53.81    R53.83        Raesean Bartoletti, Jonny RuizJohn, MD 03/26/17 [phone]

## 2017-03-26 NOTE — ED Notes (Signed)
Pt stated that she has been fatigued and sleeping a lot since she got her Debbie King out over 3 years ago. She is worried about her uterus.

## 2017-03-29 ENCOUNTER — Emergency Department
Admission: EM | Admit: 2017-03-29 | Discharge: 2017-03-30 | Disposition: A | Discharge status: Other Acute Inpatient Hospital | Payer: Medicaid Other | Attending: Physician Assistant | Admitting: Physician Assistant

## 2017-03-29 ENCOUNTER — Other Ambulatory Visit: Payer: Self-pay

## 2017-03-29 ENCOUNTER — Encounter: Payer: Self-pay | Admitting: Emergency Medicine

## 2017-03-29 DIAGNOSIS — F312 Bipolar disorder, current episode manic severe with psychotic features: Secondary | ICD-10-CM | POA: Insufficient documentation

## 2017-03-29 DIAGNOSIS — Z91199 Patient's noncompliance with other medical treatment and regimen due to unspecified reason: Secondary | ICD-10-CM

## 2017-03-29 DIAGNOSIS — R000 Tachycardia, unspecified: Secondary | ICD-10-CM | POA: Insufficient documentation

## 2017-03-29 DIAGNOSIS — Z87891 Personal history of nicotine dependence: Secondary | ICD-10-CM | POA: Insufficient documentation

## 2017-03-29 DIAGNOSIS — F29 Unspecified psychosis not due to a substance or known physiological condition: Secondary | ICD-10-CM | POA: Insufficient documentation

## 2017-03-29 DIAGNOSIS — R42 Dizziness and giddiness: Secondary | ICD-10-CM | POA: Insufficient documentation

## 2017-03-29 DIAGNOSIS — Z9119 Patient's noncompliance with other medical treatment and regimen: Secondary | ICD-10-CM

## 2017-03-29 LAB — COMPREHENSIVE METABOLIC PANEL
ALBUMIN: 5.1 g/dL — AB (ref 3.5–5.0)
ALK PHOS: 52 U/L (ref 38–126)
ALT: 18 U/L (ref 14–54)
ANION GAP: 11 (ref 5–15)
AST: 22 U/L (ref 15–41)
BILIRUBIN TOTAL: 0.9 mg/dL (ref 0.3–1.2)
BUN: 8 mg/dL (ref 6–20)
CALCIUM: 9.5 mg/dL (ref 8.9–10.3)
CO2: 24 mmol/L (ref 22–32)
Chloride: 105 mmol/L (ref 101–111)
Creatinine, Ser: 0.65 mg/dL (ref 0.44–1.00)
GFR calc Af Amer: >60 mL/min (ref >60)
GFR calc non Af Amer: >60 mL/min (ref >60)
GLUCOSE: 115 mg/dL — AB (ref 65–99)
Potassium: 3.5 mmol/L (ref 3.5–5.1)
SODIUM: 140 mmol/L (ref 135–145)
TOTAL PROTEIN: 7.9 g/dL (ref 6.5–8.1)

## 2017-03-29 LAB — URINALYSIS, COMPLETE (UACMP) WITH MICROSCOPIC
Bilirubin Urine: NEGATIVE
Glucose, UA: NEGATIVE mg/dL
Hgb urine dipstick: NEGATIVE
KETONES UR: NEGATIVE mg/dL
Nitrite: NEGATIVE
PROTEIN: NEGATIVE mg/dL
Specific Gravity, Urine: 1.01 (ref 1.005–1.030)
pH: 6 (ref 5.0–8.0)

## 2017-03-29 LAB — CBC
HCT: 44.9 % (ref 35.0–47.0)
HEMOGLOBIN: 15.2 g/dL (ref 12.0–16.0)
MCH: 30.7 pg (ref 26.0–34.0)
MCHC: 33.9 g/dL (ref 32.0–36.0)
MCV: 90.5 fL (ref 80.0–100.0)
PLATELETS: 295 10*3/uL (ref 150–440)
RBC: 4.95 MIL/uL (ref 3.80–5.20)
RDW: 13.4 % (ref 11.5–14.5)
WBC: 10.2 10*3/uL (ref 3.6–11.0)

## 2017-03-29 LAB — URINE DRUG SCREEN, QUALITATIVE (ARMC ONLY)
Amphetamines, Ur Screen: NOT DETECTED
BARBITURATES, UR SCREEN: NOT DETECTED
Benzodiazepine, Ur Scrn: NOT DETECTED
COCAINE METABOLITE, UR ~~LOC~~: NOT DETECTED
Cannabinoid 50 Ng, Ur ~~LOC~~: NOT DETECTED
MDMA (ECSTASY) UR SCREEN: NOT DETECTED
Methadone Scn, Ur: NOT DETECTED
Opiate, Ur Screen: NOT DETECTED
PHENCYCLIDINE (PCP) UR S: NOT DETECTED
Tricyclic, Ur Screen: NOT DETECTED

## 2017-03-29 LAB — POCT PREGNANCY, URINE: Preg Test, Ur: NEGATIVE

## 2017-03-29 LAB — ETHANOL: Alcohol, Ethyl (B): <10 mg/dL (ref <10)

## 2017-03-29 MED ORDER — OLANZAPINE 5 MG PO TABS
5.0000 mg | ORAL_TABLET | Freq: Two times a day (BID) | ORAL | Status: DC
  Administered 2017-03-29: 5 mg via ORAL
  Filled 2017-03-29 (×2): qty 1

## 2017-03-29 MED ORDER — LORAZEPAM 1 MG PO TABS
1.0000 mg | ORAL_TABLET | Freq: Once | ORAL | Status: AC
  Administered 2017-03-30: 1 mg via ORAL
  Filled 2017-03-29 (×2): qty 1

## 2017-03-29 MED ORDER — LORAZEPAM 2 MG/ML IJ SOLN: 2.0000 mg | INTRAMUSCULAR | Status: DC | PRN

## 2017-03-29 MED ORDER — OLANZAPINE 5 MG PO TABS
5.0000 mg | ORAL_TABLET | Freq: Once | ORAL | Status: DC
  Filled 2017-03-29: qty 1

## 2017-03-29 NOTE — ED Notes (Signed)
Sitter with patient.  
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
## 2017-03-29 NOTE — ED Notes (Signed)
IVC, informed Consulting civil engineerCharge RN, charge informed ODS

## 2017-03-29 NOTE — ED Notes (Signed)
Patient refused PM medication; with stating, "I don't have to take it."
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
## 2017-03-29 NOTE — ED Notes (Signed)
Pt to [facility] with a 1:1 sitter for 1 hour. Pt is calm, speaking with sitter. Pt given remote and water. Maintained on 15 minute checks and observation by security camera for safety.
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
## 2017-03-30 ENCOUNTER — Other Ambulatory Visit: Payer: Self-pay

## 2017-03-30 ENCOUNTER — Inpatient Hospital Stay
Admission: AD | Admit: 2017-03-30 | Discharge: 2017-04-10 | DRG: 885 | Disposition: A | Discharge status: Home / Self Care | Payer: No Typology Code available for payment source | Attending: Psychiatry | Admitting: Psychiatry

## 2017-03-30 DIAGNOSIS — Z87891 Personal history of nicotine dependence: Secondary | ICD-10-CM | POA: Diagnosis not present

## 2017-03-30 DIAGNOSIS — F250 Schizoaffective disorder, bipolar type: Secondary | ICD-10-CM | POA: Diagnosis present

## 2017-03-30 DIAGNOSIS — Z9114 Patient's other noncompliance with medication regimen: Secondary | ICD-10-CM

## 2017-03-30 DIAGNOSIS — G4700 Insomnia, unspecified: Secondary | ICD-10-CM | POA: Diagnosis present

## 2017-03-30 DIAGNOSIS — F259 Schizoaffective disorder, unspecified: Principal | ICD-10-CM | POA: Diagnosis present

## 2017-03-30 DIAGNOSIS — R45851 Suicidal ideations: Secondary | ICD-10-CM | POA: Diagnosis present

## 2017-03-30 DIAGNOSIS — I10 Essential (primary) hypertension: Secondary | ICD-10-CM | POA: Diagnosis present

## 2017-03-30 DIAGNOSIS — F203 Undifferentiated schizophrenia: Secondary | ICD-10-CM

## 2017-03-30 DIAGNOSIS — R45850 Homicidal ideations: Secondary | ICD-10-CM | POA: Diagnosis present

## 2017-03-30 DIAGNOSIS — Z91199 Patient's noncompliance with other medical treatment and regimen due to unspecified reason: Secondary | ICD-10-CM

## 2017-03-30 DIAGNOSIS — Z9119 Patient's noncompliance with other medical treatment and regimen: Secondary | ICD-10-CM | POA: Diagnosis not present

## 2017-03-30 HISTORY — DX: Major depressive disorder, single episode, unspecified: F32.9

## 2017-03-30 HISTORY — DX: Adjustment disorder with mixed anxiety and depressed mood: F43.23

## 2017-03-30 HISTORY — DX: Major depressive disorder, single episode, severe without psychotic features: F32.2

## 2017-03-30 HISTORY — DX: Suicidal ideations: R45.851

## 2017-03-30 HISTORY — DX: Major depressive disorder, recurrent severe without psychotic features: F33.2

## 2017-03-30 HISTORY — DX: Bipolar disorder, current episode depressed, mild: F31.31

## 2017-03-30 HISTORY — DX: Other specified behavioral and emotional disorders with onset usually occurring in childhood and adolescence: F98.8

## 2017-03-30 MED ORDER — LAMOTRIGINE 25 MG PO TABS
25.0000 mg | ORAL_TABLET | Freq: Every day | ORAL | Status: DC
  Administered 2017-03-30: 25 mg via ORAL
  Filled 2017-03-30 (×2): qty 1

## 2017-03-30 MED ORDER — BENZTROPINE MESYLATE 1 MG PO TABS
0.5000 mg | ORAL_TABLET | Freq: Every day | ORAL | Status: DC
  Filled 2017-03-30: qty 1

## 2017-03-30 MED ORDER — OLANZAPINE 10 MG PO TABS
10.0000 mg | ORAL_TABLET | Freq: Every day | ORAL | Status: DC
  Filled 2017-03-30: qty 1

## 2017-03-30 MED ORDER — TRAZODONE HCL 50 MG PO TABS
50.0000 mg | ORAL_TABLET | Freq: Every day | ORAL | Status: DC
  Filled 2017-03-30: qty 1

## 2017-03-30 NOTE — ED Notes (Signed)
Patient is talking on phone at this time, she is quiet and cooperative, no behavioral issues at this time. Patient is safe, q 15 minute checks and camera surveillance in progress for safety.
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
## 2017-03-30 NOTE — BH Assessment (Signed)
Patient is to be admitted to [facility] BMU by Dr. Toni Amendlapacs.  Attending Physician will be Dr. Jennet MaduroPucilowska.   Patient has been assigned to room 312, [facility] Charge [facility] Gigi.   Intake Paper Work has been signed and placed on patient chart.  ER staff is aware of the admission:  Misty StanleyLisa, ER Kathrynn SpeedSectary   Dr. Shaune PollackLord, ER MD   Toniann FailWendy, Patient's Nurse   Sharia ReeveJosh, Patient Access.

## 2017-03-30 NOTE — ED Notes (Signed)
Dr. Clapacs in talking with Patient.  

## 2017-03-30 NOTE — Tx Team (Signed)
Initial Treatment Plan 03/30/2017 11:45 PM Debbie GranaMarie Feister WUJ:[id number]    PATIENT STRESSORS: Health problems Marital or family conflict Medication change or noncompliance Occupational concerns   PATIENT STRENGTHS: Average or above average intelligence Capable of independent living General fund of knowledge Motivation for treatment/growth Supportive family/friends   PATIENT IDENTIFIED PROBLEMS: Paranoid/Psychotic    Bipolar disorder    Depression/Anxeity              DISCHARGE CRITERIA:  Ability to meet basic life and health needs Adequate post-discharge living arrangements Improved stabilization in mood, thinking, and/or behavior Motivation to continue treatment in a less acute level of care Reduction of life-threatening or endangering symptoms to within safe limits Verbal commitment to aftercare and medication compliance  PRELIMINARY DISCHARGE PLAN: Attend aftercare/continuing care group Outpatient therapy Participate in family therapy Return to previous living arrangement  PATIENT/FAMILY INVOLVEMENT: This treatment plan has been presented to and reviewed with the patient, Debbie King,  The patient  have been given the opportunity to ask questions and make suggestions.  [name]  Carolle Ishii, RN 03/30/2017, 11:45 PM

## 2017-03-30 NOTE — ED Notes (Signed)
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
## 2017-03-30 NOTE — ED Notes (Signed)

## 2017-03-30 NOTE — ED Provider Notes (Signed)
  Vitals:   03/29/17 1307 03/30/17 1427  BP: 102/62 120/68  Pulse: (!) 104 (!) 113  Resp:  20  Temp:  98.6 F (37 C)  SpO2:  100%     No acute events reported to me overnight by nursing or physician report.  Patient on [facility]C awaiting psychiatric disposition, [facility] with manic episode/psychosis.   Governor RooksLord, Cherrise Occhipinti, MD 03/30/17 1452

## 2017-03-30 NOTE — ED Notes (Signed)
Patient ate 50% of supper tray and beverage.

## 2017-03-30 NOTE — ED Notes (Signed)
Nurse talked to Patient in length, and she talked about how she is afraid people are out to get her, but nurse did ask if she could mentally sort out the fact that her thought process could be chemically altered and that our brains can be sick just as our bodies can get sick, patient agreed, and states Oh I know now why I am paranoid is because I have said hurtful things to so many people not meaning to because I am sick and now I think they are out to get me or my family, she also states I probably do need medicine to think right, and Patient took the ativan, but still refuses the olanzapine. Patient does repeat self at times, disshuffeled, but is cooperative. q 15 minute checks and camera surveillance in progress for safety.

## 2017-03-31 ENCOUNTER — Encounter: Payer: Self-pay | Admitting: Psychiatry

## 2017-03-31 DIAGNOSIS — F250 Schizoaffective disorder, bipolar type: Secondary | ICD-10-CM

## 2017-03-31 LAB — URINE CULTURE

## 2017-03-31 MED ORDER — MAGNESIUM HYDROXIDE 400 MG/5ML PO SUSP: 30.0000 mL | Freq: Every day | ORAL | Status: DC | PRN

## 2017-03-31 MED ORDER — OLANZAPINE 5 MG PO TABS: 5.0000 mg | ORAL_TABLET | Freq: Two times a day (BID) | ORAL | Status: DC | PRN

## 2017-03-31 MED ORDER — TRAZODONE HCL 100 MG PO TABS: 100.0000 mg | ORAL_TABLET | Freq: Every evening | ORAL | Status: DC | PRN

## 2017-03-31 MED ORDER — ACETAMINOPHEN 325 MG PO TABS: 650.0000 mg | ORAL_TABLET | Freq: Four times a day (QID) | ORAL | Status: DC | PRN

## 2017-03-31 MED ORDER — OLANZAPINE 10 MG PO TABS
10.0000 mg | ORAL_TABLET | Freq: Every day | ORAL | Status: DC
  Administered 2017-03-31: 10 mg via ORAL
  Filled 2017-03-31: qty 1

## 2017-03-31 MED ORDER — ALUM & MAG HYDROXIDE-SIMETH 200-200-20 MG/5ML PO SUSP: 30.0000 mL | ORAL | Status: DC | PRN

## 2017-03-31 MED ORDER — HYDROXYZINE HCL 50 MG PO TABS
50.0000 mg | ORAL_TABLET | Freq: Three times a day (TID) | ORAL | Status: DC | PRN
Start: 2017-03-31 — End: 2017-04-10

## 2017-03-31 MED ORDER — LORAZEPAM 1 MG PO TABS: 1.0000 mg | ORAL_TABLET | Freq: Four times a day (QID) | ORAL | Status: DC | PRN

## 2017-03-31 MED ORDER — OLANZAPINE 5 MG PO TABS
5.0000 mg | ORAL_TABLET | ORAL | Status: DC
  Administered 2017-03-31 – 2017-04-01 (×2): 5 mg via ORAL
  Filled 2017-03-31 (×2): qty 1

## 2017-03-31 NOTE — Progress Notes (Signed)
Received Debbie King this am, she was anxious, paranoid and had to be encouraged to take her medication. Initially she tried to cheek her Zyprexa. She eventually calmed down.  She was OOb in the milieu for short intervals.
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
## 2017-03-31 NOTE — [facility] Group Notes (Signed)
  03/31/2017  Time: 0900  Type of Therapy and Topic:  Group Therapy:  Setting Goals Participation Level:  Active  Description of Group: In this process group, patients discussed using strengths to work toward goals and address challenges.  Patients identified two positive things about themselves and one goal they were working on.  Patients were given the opportunity to share openly and support each other's plan for self-empowerment.  The group discussed the value of gratitude and were encouraged to have a daily reflection of positive characteristics or circumstances.  Patients were encouraged to identify a plan to utilize their strengths to work on current challenges and goals.  Therapeutic Goals 1. Patient will verbalize personal strengths/positive qualities and relate how these can assist with achieving desired personal goals 2. Patients will verbalize affirmation of peers plans for personal change and goal setting 3. Patients will explore the value of gratitude and positive focus as related to successful achievement of goals 4. Patients will verbalize a plan for regular reinforcement of personal positive qualities and circumstances.  Summary of Patient Progress: Pt continues to work towards their tx goals but has not yet reached them. Pt was able to appropriately participate in group discussion, and was able to offer support/validation to other group members. Pt reported her goal for treatment is to, "write at least three coping skills by the end of the day in order to feel more happy."  Therapeutic Modalities Cognitive Behavioral Therapy Motivational Interviewing  Heidi DachKelsey Matilde Pottenger, MSW, LCSW 03/31/2017 9:35 AM

## 2017-03-31 NOTE — H&P (Signed)
Psychiatric Admission Assessment Adult  Patient Identification: Debbie King MRN:  [id number] Date of Evaluation:  03/31/2017 Chief Complaint:  psychosis, paranoia Principal Diagnosis: Schizoaffective disorder (HCC) Diagnosis:   Patient Active Problem List   Diagnosis Date Noted  . Schizoaffective disorder (HCC) [F25.9] 03/31/2017    Priority: High  . Noncompliance [Z91.19] 03/30/2017  . Routine general medical examination at a health care facility [Z00.00] 11/11/2012   History of Present Illness: 22 yo female admitted due to acute psychosis and paranoia. IVC states that pt has been very paranoid and bizarre recently. She has history of psychosis and was [facility]. Upon assessment today, pt is initially very calm. However, she becomes very distressed, nervous and paranoid. She is rambling and hyperverbal. She states, "I am an ADHD kid. I was on birth control and it made me more depressed. 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 I was off medications for 3 years. I have a mom and dad and sister and I was hanging with stupid people and got arrested." She states, "I was communicating threats to people and said I would kill myself." She perseverates on the fact that she was communicating threats but unable to elaborate on this. She states that she got out of jail on February 14th where she was for 40 days. She states over and over again, " I think I was drugged or food poisoned. I feel like I was set up and people are trying to kills me." She is perseverative on her ex-boyfriend who she states is a heroin addict trying to kill her. She states My boyfriend was upset because I didn't want to be with him and I was communicating threats. The police were tying to kill me. I am set up to die. Please don't let me die." She states, "I was communicating threats but I didn't mean it. I said it out of an ger." She states over and over, "I think I was food poisoned or  drugged." She states, "I don't want to die and I don't want to kills myself but I am mentally ill." She states, "My ex-boyfriend has ties with [employer] and they are watching me and set me up. They have access to everything int he hospital. She states taht she feels her ex-boyfriend is sending her messages through her Pand[employer] music application. She states that the songs correlate to them watching her and setting her up to die. Pt is difficult to redirect and extremely anxious and paranoid. She states that she feels like the Zyprexa that she got in the ED did help to calm her down and "cleared my thought process." She states that she has been sleeping well. She denies any recent drug or alcohol use.    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 She has at least one inpatient [place] in [place] and [place].   Past medications: Celexa, Prozac, Lamictal, Lexparo, Abilify  Is the patient at risk to self? Yes.    Has the patient been a risk to self in the past 6 months? No.  Has the patient been a risk to  self within the distant past? No.  Is the patient a risk to others? No.  Has the patient been a risk to others in the past 6 months? No.  Has the patient been a risk to others within the distant past? No.   Alcohol Screening: 1. How often do you have a drink containing alcohol?: Monthly or less 2. How many drinks containing alcohol do you have on a typical day when you are drinking?: 1 or 2 3. How often do you have six or more  drinks on one occasion?: Less than monthly AUDIT-C Score: 2 4. How often during the last year have you found that you were not able to stop drinking once you had started?: Less than monthly 6. How often during the last year have you needed a first drink in the morning to get yourself going after a heavy drinking session?: Never 7. How often during the last year have you had a feeling of guilt of remorse after drinking?: Never 8. How often during the last year have you been unable to remember what happened the night before because you had been drinking?: Less than monthly 9. Have you or someone else been injured as a result of your drinking?: No 10. Has a relative or friend or a doctor or another health worker been concerned about your drinking or suggested you cut down?: No Alcohol Use Disorder Identification Test Final Score (AUDIT): 4 Intervention/Follow-up: Alcohol Education Substance Abuse History in the last 12 months:  No. Consequences of Substance Abuse: Negative Previous Psychotropic Medications: Yes  Psychological Evaluations: Yes  Past Medical History:  Past Medical History:  Diagnosis Date  . Anxiety   . Bipolar disorder (HCC)   . Current non-smoker but past smoking history unknown   . Depression     Past Surgical History:  Procedure Laterality Date  . TONSILLECTOMY    . WISDOM TOOTH EXTRACTION     Family History: History reviewed. No pertinent family history. Family Psychiatric  History: Mother-bipolar disorder Tobacco Screening: Have you used any form of tobacco in the last 30 days? (Cigarettes, Smokeless Tobacco, Cigars, and/or Pipes): No Social History: Born in [place]. Lives in [place] with her father. She was recently in jail for 40 days for disorderly conduct. Difficult to get remaining history due to acute psychosis.  Social History   Substance and Sexual Activity  Alcohol Use No  . Alcohol/week: 0.0 oz     Social History   Substance and Sexual Activity   Drug Use No   Comment: Former Armed forces technical officer Social History: Marital status: Single Are you sexually active?: No What is your sexual orientation?: Heterosexual Has your sexual activity been affected by drugs, alcohol, medication, or emotional stress?: Pt denies.  Does patient have children?: No                         Allergies:   Allergies  Allergen Reactions  . Citalopram Itching  . Haldol [Haloperidol Lactate] Other (See Comments)    Something happened to jaw    Lab Results:  Results for orders placed or performed during the hospital encounter of 03/29/17 (from the past 48 hour(s))  Urine culture     Status: Abnormal   Collection Time: 03/29/17  3:34 PM  Result Value Ref Range   Specimen Description      URINE, RANDOM Performed at [facility], [address] [place], [place] [address]    Special Requests  NONE Performed at [facility], [address], [place], [place] [address]    Culture (A)     <10,000 COLONIES/mL Performed at [facility], [address] [place], [place] [address]    Report Status 03/31/2017 FINAL     Blood Alcohol level:  Lab Results  Component Value Date   ETH <10 03/29/2017   ETH <11 10/14/2013    Metabolic Disorder Labs:  Lab Results  Component Value Date   HGBA1C 5.3 10/25/2013   MPG 105 10/25/2013   No results found for: PROLACTIN Lab Results  Component Value Date   CHOL 140 10/25/2013   TRIG 140 10/25/2013   HDL 44 10/25/2013   CHOLHDL 3.2 10/25/2013   VLDL 28 10/25/2013   LDLCALC 68 10/25/2013    Current Medications: Current Facility-Administered Medications  Medication Dose Route Frequency Provider Last Rate Last Dose  . acetaminophen (TYLENOL) tablet 650 mg  650 mg Oral Q6H PRN Ulrich Soules, Ileene Hutchinson, MD      . alum & mag hydroxide-simeth (MAALOX/MYLANTA) 200-200-20 MG/5ML suspension 30 mL  30 mL Oral Q4H PRN Natally Ribera, Ileene Hutchinson, MD      . hydrOXYzine (ATARAX/VISTARIL) tablet 50 mg   50 mg Oral TID PRN Mahir Prabhakar, Ileene Hutchinson, MD      . LORazepam (ATIVAN) tablet 1 mg  1 mg Oral Q6H PRN Emma-Lee Oddo R, MD      . magnesium hydroxide (MILK OF MAGNESIA) suspension 30 mL  30 mL Oral Daily PRN Melisse Caetano R, MD      . OLANZapine (ZYPREXA) tablet 5 mg  5 mg Oral BH-q7a Devan Danzer, Ileene Hutchinson, MD       And  . OLANZapine (ZYPREXA) tablet 10 mg  10 mg Oral QHS Tajae Rybicki R, MD      . OLANZapine (ZYPREXA) tablet 5 mg  5 mg Oral BID PRN Jaline Pincock, Ileene Hutchinson, MD      . traZODone (DESYREL) tablet 100 mg  100 mg Oral QHS PRN Shalana Jardin, Ileene Hutchinson, MD       PTA Medications: No medications prior to admission.    Musculoskeletal: Strength & Muscle Tone: within normal limits Gait & Station: normal Patient leans: N/A  Psychiatric Specialty Exam: Physical Exam  Nursing note and vitals reviewed.   Review of Systems  All other systems reviewed and are negative.   Blood pressure 105/70, pulse (!) 106, temperature 98 F (36.7 C), temperature source Oral, resp. rate 18, height 5\' 3"  (1.6 m), weight 62.1 kg (137 lb), SpO2 100 %.Body mass index is 24.27 kg/m.  General Appearance: Disheveled  Eye Contact:  Fair  Speech:  Pressured  Volume:  Increased  Mood:  Anxious and Depressed  Affect:  Labile  Thought Process:  Disorganized  Orientation:  Full (Time, Place, and Person)  Thought Content:  Delusions and Paranoid Ideation  Suicidal Thoughts:  Yes.  without intent/plan  Homicidal Thoughts:  Yes.  without intent/plan  Memory:  Immediate;   Fair  Judgement:  Impaired  Insight:  Lacking  Psychomotor Activity:  Increased  Concentration:  Concentration: Poor  Recall:  [employer] of Knowledge:  Fair  Language:  Fair  Akathisia:  No      Assets:  Resilience  ADL's:  Intact  Cognition:  WNL  Sleep:  Number of Hours: 4.15    Treatment Plan Summary: 22 yo female admitted due to acute psychosis. She is extremely paranoid, fearful, disorganized and hyperverbal. She has a history of AH per past  chart  review.   Plan:  Psychosis -Start Zyprexa 5 mg qam and 10 mg qhs -prn Ativan for anxiety  Dispo -She has been staying with her father.   Observation Level/Precautions:  15 minute checks  Laboratory:  Done in ED  Psychotherapy:    Medications:    Consultations:    Discharge Concerns:    Estimated LOS: 5-7 days  Other:     Physician Treatment Plan for Primary Diagnosis: Schizoaffective disorder (HCC) Long Term Goal(s): Improvement in symptoms so as ready for discharge  Short Term Goals: Ability to verbalize feelings will improve and Ability to disclose and discuss suicidal ideas  Physician Treatment Plan for Secondary Diagnosis: Active Problems:   Schizoaffective disorder (HCC)   I certify that inpatient services furnished can reasonably be expected to improve the patient's condition.    Haskell Riling, MD 2/26/20191:20 PM

## 2017-03-31 NOTE — [facility] Group Notes (Signed)
03/31/2017 1PM  Type of Therapy/Topic:  Group Therapy:  Feelings about Diagnosis  Participation Level:  Did Not Attend   Description of Group:   This group will allow patients to explore their thoughts and feelings about diagnoses they have received. Patients will be guided to explore their level of understanding and acceptance of these diagnoses. Facilitator will encourage patients to process their thoughts and feelings about the reactions of others to their diagnosis and will guide patients in identifying ways to discuss their diagnosis with significant others in their lives. This group will be process-oriented, with patients participating in exploration of their own experiences, giving and receiving support, and processing challenge from other group members.   Therapeutic Goals: 1. Patient will demonstrate understanding of diagnosis as evidenced by identifying two or more symptoms of the disorder 2. Patient will be able to express two feelings regarding the diagnosis 3. Patient will demonstrate their ability to communicate their needs through discussion and/or role play  Summary of Patient Progress: Patient was encouraged and invited to attend group. Patient did not attend group. Social worker will continue to encourage group participation in the future.        Therapeutic Modalities:   Cognitive Behavioral Therapy Brief Therapy Feelings Identification    Johny ShearsCassandra  Ashyah Quizon, LCSW 03/31/2017 1:49 PM

## 2017-03-31 NOTE — [facility] Suicide Risk Assessment (Addendum)
[facility] INPATIENT:  Family/Significant Other Suicide Prevention Education  Suicide Prevention Education:  Contact Attempts: Pt's mother, Westley HummerCindy Mcgillivray at [phone]) [phone], has been identified by the patient as the family member/significant other with whom the patient will be residing, and identified as the person(s) who will aid the patient in the event of a mental health crisis.  With written consent from the patient, two attempts were made to provide suicide prevention education, prior to and/or following the patient's discharge.  We were unsuccessful in providing suicide prevention education.  A suicide education pamphlet was given to the patient to share with family/significant other.  Date and time of first attempt: 03/31/17 at 10:50AM. CSW was informed this was the wrong number. CSW will attempt to obtain another phone number from pt and will reattempt again tomorrow.  Date and time of second attempt: 03/30/17 at 1:20PM   Heidi DachKelsey Normal Recinos, LCSW 03/31/2017, 10:52 AM

## 2017-04-01 MED ORDER — OLANZAPINE 5 MG PO TBDP
5.0000 mg | ORAL_TABLET | ORAL | Status: DC
  Filled 2017-04-01: qty 1

## 2017-04-01 MED ORDER — CLONIDINE HCL 0.1 MG PO TABS: 0.1000 mg | ORAL_TABLET | Freq: Three times a day (TID) | ORAL | Status: DC | PRN

## 2017-04-01 MED ORDER — OLANZAPINE 5 MG PO TBDP
10.0000 mg | ORAL_TABLET | Freq: Every day | ORAL | Status: DC
  Administered 2017-04-01: 10 mg via ORAL
  Filled 2017-04-01: qty 2

## 2017-04-01 NOTE — Plan of Care (Signed)
Patient verbalizing understanding of  information received   Some  evidence  of use of coping skills  observed . No safety concerns voiced . Less anxiety    

## 2017-04-01 NOTE — [facility] Group Notes (Signed)
  04/01/2017  Time: 1PM  Type of Therapy/Topic:  Group Therapy:  Emotion Regulation  Participation Level:  Active   Description of Group:    The purpose of this group is to assist patients in learning to regulate negative emotions and experience positive emotions. Patients will be guided to discuss ways in which they have been vulnerable to their negative emotions. These vulnerabilities will be juxtaposed with experiences of positive emotions or situations, and patients will be challenged to use positive emotions to combat negative ones. Special emphasis will be placed on coping with negative emotions in conflict situations, and patients will process healthy conflict resolution skills.  Therapeutic Goals: 1. Patient will identify two positive emotions or experiences to reflect on in order to balance out negative emotions 2. Patient will label two or more emotions that they find the most difficult to experience 3. Patient will demonstrate positive conflict resolution skills through discussion and/or role plays  Summary of Patient Progress: Pt continues to work towards their tx goals but has not yet reached them. Pt was able to appropriately participate in group discussion, and was able to offer support/validation to other group members. Pt reported feeling, "fine today. I think I've calmed down a little." Pt reported one way she can stay safe and calm in the moment, and appropriately manage her emotions, is to "draw."   Therapeutic Modalities:   Cognitive Behavioral Therapy Feelings Identification Dialectical Behavioral Therapy  Heidi DachKelsey Deklyn Gibbon, MSW, LCSW 04/01/2017 1:52 PM

## 2017-04-01 NOTE — Tx Team (Signed)
Interdisciplinary Treatment and Diagnostic Plan Update  04/01/2017 Time of Session: 1100 Debbie King MRN: [id number]  Principal Diagnosis: Schizoaffective disorder [facility](HCC)  Secondary Diagnoses: Principal Problem:   Schizoaffective disorder (HCC)   Current Medications:  Current Facility-Administered Medications  Medication Dose Route Frequency Provider Last Rate Last Dose  . acetaminophen (TYLENOL) tablet 650 mg  650 mg Oral Q6H PRN McNew, Ileene HutchinsonHolly R, MD      . alum & mag hydroxide-simeth (MAALOX/MYLANTA) 200-200-20 MG/5ML suspension 30 mL  30 mL Oral Q4H PRN McNew, Ileene HutchinsonHolly R, MD      . hydrOXYzine (ATARAX/VISTARIL) tablet 50 mg  50 mg Oral TID PRN McNew, Ileene HutchinsonHolly R, MD      . LORazepam (ATIVAN) tablet 1 mg  1 mg Oral Q6H PRN McNew, Ileene HutchinsonHolly R, MD      . magnesium hydroxide (MILK OF MAGNESIA) suspension 30 mL  30 mL Oral Daily PRN McNew, Holly R, MD      . OLANZapine (ZYPREXA) tablet 5 mg  5 mg Oral BID PRN Haskell RilingMcNew, Holly R, MD      . Melene Muller[START ON 04/02/2017] OLANZapine zydis (ZYPREXA) disintegrating tablet 5 mg  5 mg Oral BH-q7a McNew, Ileene HutchinsonHolly R, MD       And  . OLANZapine zydis (ZYPREXA) disintegrating tablet 10 mg  10 mg Oral QHS McNew, Holly R, MD      . traZODone (DESYREL) tablet 100 mg  100 mg Oral QHS PRN McNew, Ileene HutchinsonHolly R, MD       PTA Medications: No medications prior to admission.    Patient Stressors: Health problems Marital or family conflict Medication change or noncompliance Occupational concerns  Patient Strengths: Average or above average intelligence Capable of independent living General fund of knowledge Motivation for treatment/growth Supportive family/friends  Treatment Modalities: Medication Management, Group therapy, Case management,  1 to 1 session with clinician, Psychoeducation, Recreational therapy.   Physician Treatment Plan for Primary Diagnosis: Schizoaffective disorder (HCC) Long Term Goal(s): Improvement in symptoms so as ready for discharge   Short Term Goals:  Ability to verbalize feelings will improve Ability to disclose and discuss suicidal ideas  Medication Management: Evaluate patient's response, side effects, and tolerance of medication regimen.  Therapeutic Interventions: 1 to 1 sessions, Unit Group sessions and Medication administration.  Evaluation of Outcomes: Progressing  Physician Treatment Plan for Secondary Diagnosis: Principal Problem:   Schizoaffective disorder (HCC)  Long Term Goal(s): Improvement in symptoms so as ready for discharge   Short Term Goals: Ability to verbalize feelings will improve Ability to disclose and discuss suicidal ideas     Medication Management: Evaluate patient's response, side effects, and tolerance of medication regimen.  Therapeutic Interventions: 1 to 1 sessions, Unit Group sessions and Medication administration.  Evaluation of Outcomes: Progressing   RN Treatment Plan for Primary Diagnosis: Schizoaffective disorder (HCC) Long Term Goal(s): Knowledge of disease and therapeutic regimen to maintain health will improve  Short Term Goals: Ability to participate in decision making will improve, Ability to identify and develop effective coping behaviors will improve and Compliance with prescribed medications will improve  Medication Management: RN will administer medications as ordered by provider, will assess and evaluate patient's response and provide education to patient for prescribed medication. RN will report any adverse and/or side effects to prescribing provider.  Therapeutic Interventions: 1 on 1 counseling sessions, Psychoeducation, Medication administration, Evaluate responses to treatment, Monitor vital signs and CBGs as ordered, Perform/monitor CIWA, COWS, AIMS and Fall Risk screenings as ordered, Perform wound care treatments as ordered.  Evaluation of Outcomes: Progressing   LCSW Treatment Plan for Primary Diagnosis: Schizoaffective disorder (HCC) Long Term Goal(s): Safe transition  to appropriate next level of care at discharge, Engage patient in therapeutic group addressing interpersonal concerns.  Short Term Goals: Engage patient in aftercare planning with referrals and resources, Increase emotional regulation and Increase skills for wellness and recovery  Therapeutic Interventions: Assess for all discharge needs, 1 to 1 time with Social worker, Explore available resources and support systems, Assess for adequacy in community support network, Educate family and significant other(s) on suicide prevention, Complete Psychosocial Assessment, Interpersonal group therapy.  Evaluation of Outcomes: Progressing   Progress in Treatment: Attending groups: No. Participating in groups: No. Taking medication as prescribed: Yes. Toleration medication: Yes. Family/Significant other contact made: Yes, individual(s) contacted:  pt's mother.  Patient understands diagnosis: Yes. Discussing patient identified problems/goals with staff: Yes. Medical problems stabilized or resolved: Yes. Denies suicidal/homicidal ideation: Yes. Issues/concerns per patient self-inventory: No. Other: None at this time.  New problem(s) identified: No, Describe:  none at this time.   New Short Term/Long Term Goal(s): Pt reported her goal for treatment is to, "find a medication that works and get happy."   Discharge Plan or Barriers: CSW will continue to assess for an appropriate discharge plan. At this time, pt will most likely discharge home and continue tx in the outpatient setting.   Reason for Continuation of Hospitalization: Delusions  Hallucinations Medication stabilization  Estimated Length of Stay: 5-7 days  Attendees: Patient: Debbie King  04/01/2017 11:37 AM  Physician: Dr. Johnella Moloney, MD 04/01/2017 11:37 AM  Nursing: Ace Gins, RN 04/01/2017 11:37 AM  RN Care Manager: 04/01/2017 11:37 AM  Social Worker: [name], LCSW 04/01/2017 11:37 AM  Recreational Therapist:  04/01/2017 11:37 AM   Other: Johny Shears, LCSWA 04/01/2017 11:37 AM  Other:  04/01/2017 11:37 AM  Other: 04/01/2017 11:37 AM    Scribe for Treatment Team: [name], LCSW 04/01/2017 11:42 AM
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
## 2017-04-01 NOTE — Plan of Care (Signed)
Patient slept for Estimated Hours of 7.00; Precautionary checks every 15 minutes for safety maintained, room free of safety hazards, patient sustains no injury or falls during this shift.

## 2017-04-02 MED ORDER — RISPERIDONE 1 MG PO TABS: 1.0000 mg | ORAL_TABLET | Freq: Two times a day (BID) | ORAL | Status: DC

## 2017-04-02 MED ORDER — RISPERIDONE 1 MG PO TABS
2.0000 mg | ORAL_TABLET | Freq: Every day | ORAL | Status: DC
  Administered 2017-04-03 – 2017-04-05 (×3): 2 mg via ORAL
  Filled 2017-04-02 (×4): qty 2

## 2017-04-02 NOTE — [facility] Group Notes (Signed)
04/02/2017  Time: 1:00PM  Type of Therapy/Topic:  Group Therapy:  Balance in Life  Participation Level:  Active  Description of Group:   This group will address the concept of balance and how it feels and looks when one is unbalanced. Patients will be encouraged to process areas in their lives that are out of balance and identify reasons for remaining unbalanced. Facilitators will guide patients in utilizing problem-solving interventions to address and correct the stressor making their life unbalanced. Understanding and applying boundaries will be explored and addressed for obtaining and maintaining a balanced life. Patients will be encouraged to explore ways to assertively make their unbalanced needs known to significant others in their lives, using other group members and facilitator for support and feedback.  Therapeutic Goals: 1. Patient will identify two or more emotions or situations they have that consume much of in their lives. 2. Patient will identify signs/triggers that life has become out of balance:  3. Patient will identify two ways to set boundaries in order to achieve balance in their lives:  4. Patient will demonstrate ability to communicate their needs through discussion and/or role plays  Summary of Patient Progress: Pt continues to work towards their tx goals but has not yet reached them. Pt was able to appropriately participate in group discussion, and was able to offer support/validation to other group members. Pt reported feeling, "weird. I was throwing up all night last night and I think it's because of the medications." Pt reported one area of her life she would like to devote more attention to is her, "employment. 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

## 2017-04-02 NOTE — Plan of Care (Signed)
Patient has not  verbalized understanding of  information received  does not show any evidence  of use of coping skills. No safety concerns voiced. Some anxiety observed.
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
## 2017-04-02 NOTE — Progress Notes (Signed)
Patient ID: Debbie King, female   DOB: 1995-04-09, 22 y.o.   MRN: [id number] Paranoid still, pleasant, assertive, self reported assessment and positive response to medications, "I slept better last night and I will like to have the same medications again tonight.." Attentive to changes in medication route, "this is different from the one I had yesterday and why do I have to place this under my tongue until dissolves.." Understanding of explanations provided for ODT verbalized. Patient, then, asked "can I get pregnant while I am in here?.." Removed from the med room to a remote area to allow free expression of feelings that protects her PHI/HIPPAA. Patient further clarified her concerns, "I just want to make sure that the medication will not affect the babe..." Pregnancy test obtained on 2/24 was negative and she was satisfied so far.

## 2017-04-02 NOTE — [facility] Group Notes (Signed)
  04/02/2017 9am  Type of Therapy and Topic: Group Therapy: Goals Group: SMART Goals   Participation Level: Did Not Attend  Description of Group:    The purpose of a daily goals group is to assist and guide patients in setting recovery/wellness-related goals. The objective is to set goals as they relate to the crisis in which they were admitted. Patients will be using SMART goal modalities to set measurable goals. Characteristics of realistic goals will be discussed and patients will be assisted in setting and processing how one will reach their goal. Facilitator will also assist patients in applying interventions and coping skills learned in psycho-education groups to the SMART goal and process how one will achieve defined goal.   Therapeutic Goals:   -Patients will develop and document one goal related to or their crisis in which brought them into treatment.  -Patients will be guided by LCSW using SMART goal setting modality in how to set a measurable, attainable, realistic and time sensitive goal.  -Patients will process barriers in reaching goal.  -Patients will process interventions in how to overcome and successful in reaching goal.   Patient's Goal: Patient was encouraged and invited to attend group. Patient did not attend group. Social worker will continue to encourage group participation in the future.    Therapeutic Modalities:  Motivational Interviewing  Cognitive Behavioral Therapy  Crisis Intervention Model  SMART goals setting  Nakyia Dau, LCSW 04/02/2017 9:40 AM   

## 2017-04-02 NOTE — Progress Notes (Signed)
Patient lying in bed with eyes closed majority of morning. Patient refused to take her Zyprexa stating, "That yellow pill is what made me throw up last night,  I am not taking that anymore. I don't need that medicine. I was poisoned before I came in." Does not attend any group sessions, remains isolative to her room. Affect sad and flat with some paranoia exhibited. Milieu remains safe with q 15 minute safety checks noted.

## 2017-04-02 NOTE — Plan of Care (Signed)
Patient slept for Estimated Hours of 7; Precautionary checks every 15 minutes for safety maintained, room free of safety hazards, patient sustains no injury or falls during this shift.  

## 2017-04-03 NOTE — Progress Notes (Signed)
Patient is alert and oriented and denying SI/HI. Patient displayed increased agitations saying that Debbie King needs to be discharged, that Debbie King was not supposed to be admitted here. Argued about her medication (resperdal) saying that 2mg  is a high dose, that Debbie King was going to take only 1mg  "this is a high dose, too much medication in my system". Patient was encouraged to take the medication as prescribed and discuss her concern with MD in AM. Patient did not eat any snack. Returned to her room and currently sleeping. Safety precautions reinforced.

## 2017-04-03 NOTE — Plan of Care (Signed)
Anxious and agitated, not understanding medication regime and treatment plan

## 2017-04-03 NOTE — Progress Notes (Signed)
Patient is alert and responsive to staff. Denies SI/HI/AVH, present in the milieu. Encouraged to eat her meal, milieu remains safe. Patient stated that she would take her medicine today. "I just want to get out of here." 

## 2017-04-03 NOTE — [facility] Group Notes (Signed)
[facility] Group Notes:  (Nursing/MHT/Case Management/Adjunct)  Date:  04/03/2017  Time:  3:32 PM  Type of Therapy:  Psychoeducational Skills  Participation Level:  Did Not Attend  Debbie SmokeCara Travis [facility] 04/03/2017, 3:32 PM

## 2017-04-03 NOTE — [facility] Group Notes (Signed)
LCSW Group Therapy Note  04/03/2017 1:00 pm  Type of Therapy and Topic:  Group Therapy:  Feelings around Relapse and Recovery  Participation Level:  Did Not Attend   Description of Group:    Patients in this group will discuss emotions they experience before and after a relapse. They will process how experiencing these feelings, or avoidance of experiencing them, relates to having a relapse. Facilitator will guide patients to explore emotions they have related to recovery. Patients will be encouraged to process which emotions are more powerful. They will be guided to discuss the emotional reaction significant others in their lives may have to their relapse or recovery. Patients will be assisted in exploring ways to respond to the emotions of others without this contributing to a relapse.  Therapeutic Goals: 1. Patient will identify two or more emotions that lead to a relapse for them 2. Patient will identify two emotions that result when they relapse 3. Patient will identify two emotions related to recovery 4. Patient will demonstrate ability to communicate their needs through discussion and/or role plays   Summary of Patient Progress:     Therapeutic Modalities:   Cognitive Behavioral Therapy Solution-Focused Therapy Assertiveness Training Relapse Prevention Therapy   Alease FrameSonya S Anjolina Byrer, LCSW 04/03/2017 3:51 PM

## 2017-04-03 NOTE — Progress Notes (Signed)
[facility] MD Progress Note  04/03/2017 1:03 PM Debbie King  MRN:  [id number]   Subjective:  Pt refused medications last night. She states that she does not feel good this morning and states that her milk was poisoned. She states that she does not feel safe here and that she is going to die in here. She feels that staff is out to get her. She states that she wants to leave and stay with her grandparents. She is argumentative today and yelling about medications. She states that she does not want to take medications because "they are all drugs and poison." She is much more anxious today than yesterday. Vitals have been good. She did sleep last night. She was observed playing basketball with peers and interacting appropriately with peers on the unit.   I spoke with her mother today. Her mother states that pt is very paranoid that someone is going to kill her and poison her. Her mother feels that she has schizophrenia because she has seen her talking to herself in the past. Her mother states that there is truth to the fact that people are threatening her and the family. Her mother also feels that the cops have been targeting her, as well. They plan to get a restraining order against the ex-boyfriend. Her mother wants her to stay in [place] because pt is very triggered in [place] and being threatened there. Her mother is going to try to talk pt into taking medications because her mother strongly feels she needs medications, whether forced or voluntary. We did discuss option of long acting anti-psychotics which could be an option for her due to history of non compliance.   Principal Problem: Schizoaffective disorder (HCC) Diagnosis:   Patient Active Problem List   Diagnosis Date Noted  . Schizoaffective disorder (HCC) [F25.9] 03/31/2017    Priority: High  . Noncompliance [Z91.19] 03/30/2017  . Routine general medical examination at a health care facility [Z00.00] 11/11/2012   Total Time spent with patient:  20 minutes  Past Psychiatric History: See H&P  Past Medical History:  Past Medical History:  Diagnosis Date  . ADD (attention deficit disorder) 12/13/2013  . Adjustment disorder with mixed anxiety and depressed mood 12/13/2013  . Anxiety   . Bipolar 1 disorder, depressed, mild (HCC) 12/13/2013  . Bipolar disorder (HCC)   . Current non-smoker but past smoking history unknown   . Depression   . Depression with suicidal ideation 10/08/2013  . Major depressive disorder, recurrent episode, severe, without mention of psychotic behavior 10/14/2013  . Major depressive disorder, single episode, severe, without mention of psychotic behavior 10/14/2013  . Suicidal ideations 10/14/2013    Past Surgical History:  Procedure Laterality Date  . TONSILLECTOMY    . WISDOM TOOTH EXTRACTION     Family History: History reviewed. No pertinent family history. Family Psychiatric  History: See H&P Social History:  Social History   Substance and Sexual Activity  Alcohol Use No  . 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 Marital status: Single    Spouse name: None  . Number of children: None  . Years of education: None  . Highest education level: None  Social Needs  . Financial resource strain: None  . Food insecurity - worry: None  . Food insecurity - inability: None  . Transportation needs - medical: None  .  Transportation needs - non-medical: None  Occupational History  . None  Tobacco Use  . Smoking status: Former Games developer  . Smokeless tobacco: Never Used  Substance and Sexual Activity  . Alcohol use: No    Alcohol/week: 0.0 oz  . Drug use: No    Comment: Former Neurosurgeon  . Sexual activity: No  Other Topics Concern  . None  Social History Narrative   ** Merged History Encounter **       Additional Social History:                         Sleep:  Poor  Appetite:  Poor  Current Medications: Current Facility-Administered Medications  Medication Dose Route Frequency Provider Last Rate Last Dose  . acetaminophen (TYLENOL) tablet 650 mg  650 mg Oral Q6H PRN Shamiya Demeritt, Ileene Hutchinson, MD      . alum & mag hydroxide-simeth (MAALOX/MYLANTA) 200-200-20 MG/5ML suspension 30 mL  30 mL Oral Q4H PRN Jaylun Fleener, Ileene Hutchinson, MD      . cloNIDine (CATAPRES) tablet 0.1 mg  0.1 mg Oral TID PRN Haruko Mersch, Ileene Hutchinson, MD      . hydrOXYzine (ATARAX/VISTARIL) tablet 50 mg  50 mg Oral TID PRN Kila Godina, Ileene Hutchinson, MD      . LORazepam (ATIVAN) tablet 1 mg  1 mg Oral Q6H PRN Jerri Hargadon, Ileene Hutchinson, MD      . magnesium hydroxide (MILK OF MAGNESIA) suspension 30 mL  30 mL Oral Daily PRN Shametra Cumberland, Ileene Hutchinson, MD      . OLANZapine (ZYPREXA) tablet 5 mg  5 mg Oral BID PRN Emeree Mahler R, MD      . risperiDONE (RISPERDAL) tablet 2 mg  2 mg Oral QHS Lynette Topete R, MD      . traZODone (DESYREL) tablet 100 mg  100 mg Oral QHS PRN Javia Dillow, Ileene Hutchinson, MD        Lab Results: No results found for this or any previous visit (from the past 48 hour(s)).  Blood Alcohol level:  Lab Results  Component Value Date   ETH <10 03/29/2017   ETH <11 10/14/2013    Metabolic Disorder Labs: Lab Results  Component Value Date   HGBA1C 5.3 10/25/2013   MPG 105 10/25/2013   No results found for: PROLACTIN Lab Results  Component Value Date   CHOL 140 10/25/2013   TRIG 140 10/25/2013   HDL 44 10/25/2013   CHOLHDL 3.2 10/25/2013   VLDL 28 10/25/2013   LDLCALC 68 10/25/2013    Physical Findings: AIMS: Facial and Oral Movements Muscles of Facial Expression: None, normal Lips and Perioral Area: None, normal Jaw: None, normal Tongue: None, normal,Extremity Movements Upper (arms, wrists, hands, fingers): None, normal Lower (legs, knees, ankles, toes): None, normal, Trunk Movements Neck, shoulders, hips: None, normal, Overall Severity Severity of abnormal movements (highest score from questions above): None,  normal Incapacitation due to abnormal movements: None, normal Patient's awareness of abnormal movements (rate only patient's report): No Awareness, Dental Status Current problems with teeth and/or dentures?: No Does patient usually wear dentures?: No  CIWA:  CIWA-Ar Total: 2 COWS:  COWS Total Score: 4  Musculoskeletal: Strength & Muscle Tone: within normal limits Gait & Station: normal Patient leans: N/A  Psychiatric Specialty Exam: Physical Exam  Nursing note and vitals reviewed.   Review of Systems  All other systems reviewed and are negative.   Blood pressure 114/80, pulse 90, temperature 98.5 F (36.9 C), temperature source Oral, resp. rate  18, height 5\' 3"  (1.6 m), weight 62.1 kg (137 lb), SpO2 99 %.Body mass index is 24.27 kg/m.  General Appearance: Disheveled  Eye Contact:  Minimal  Speech:  Clear and Coherent  Volume:  Normal  Mood:  Irritable  Affect:  Tearful  Thought Process:  Disorganized  Orientation:  Full (Time, Place, and Person)  Thought Content:  Illogical, Delusions and Paranoid Ideation  Suicidal Thoughts:  No  Homicidal Thoughts:  No  Memory:  Immediate;   Fair  Judgement:  Impaired  Insight:  Poor  Psychomotor Activity:  Normal  Concentration:  Concentration: Fair and Attention Span: Fair  Recall:  [employer]  Fund of Knowledge:  Fair  Language:  Fair  Akathisia:  No      Assets:  Resilience  ADL's:  Intact  Cognition:  WNL  Sleep:  Number of Hours: 6.45     Treatment Plan Summary: 22 yo female admitted due to psychosis. She has been refusing medications the past couple days. She is mch more paranoid again today. She feels her food is being poisoned here and that someone is going to kill her. Her mother is going to come visit her tonight and try to reassure her about medications. Her mother strongly feels she needs to be on medications. Pt is very anxious today. There is some truth to the fact that pt is being threatened (confirmed by family).  However, pt is slightly disorganized and extremely perseverative on everyone poisoning her and trying to kill her even in the hospital. 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 Her mother will attempt to discuss with pt.  -Consider Gean BirchwoodInvega Sustenna if she tolerates Risperdal  Dispo -She will likely stay with her grandparents in [place] per her mother.     Haskell RilingHolly R Michelle Vanhise, MD 04/03/2017, 1:03 PM

## 2017-04-03 NOTE — Plan of Care (Signed)
Patient is alert and responsive to staff. Denies SI/HI/AVH, present in the milieu. Encouraged to eat her meal, milieu remains safe. Patient stated that she would take her medicine today. "I just want to get out of here."

## 2017-04-04 NOTE — Plan of Care (Signed)
Patient is alert and oriented X 4. Patient is very paranoid. Patient complaint for this morning was she received medication last night and it made her too sleepy. Patient states she does not want to take medications tonight. Patient isolates to her room. Patient affect is very irritable. Patient's appetite has been poor due to believing her boyfriend works in [employer] and is fixing her food and is trying to poison meals. Patient pain 0/10. has Vitals are within normal limits. Patient sleeping most of the time. Nurse will continue to monitor  Education: Ability to state activities that reduce stress will improve 04/04/2017 1350 - Not Progressing by Leamon ArntPowell, Flornce Record K, RN   Coping: Ability to identify and develop effective coping behavior will improve 04/04/2017 1350 - Not Progressing by Leamon ArntPowell, Aylinn Rydberg K, RN   Self-Concept: Ability to identify factors that promote anxiety will improve 04/04/2017 1350 - Not Progressing by Leamon ArntPowell, Jaria Conway K, RN Level of anxiety will decrease 04/04/2017 1350 - Not Progressing by Leamon ArntPowell, Shanie Mauzy K, RN Ability to modify response to factors that promote anxiety will improve 04/04/2017 1350 - Not Progressing by Leamon ArntPowell, Areeba Sulser K, RN   Education: Will be free of psychotic symptoms 04/04/2017 1350 - Not Progressing by Leamon ArntPowell, Edlin Ford K, RN Knowledge of the prescribed therapeutic regimen will improve 04/04/2017 1350 - Not Progressing by Leamon ArntPowell, Jayra Choyce K, RN

## 2017-04-04 NOTE — Progress Notes (Signed)
Precautionary checks every 15 minutes for safety maintained, room free of safety hazards, patient sustains no injury or falls during this shift. 

## 2017-04-04 NOTE — Progress Notes (Signed)
[facility] MD Progress Note  04/04/2017 1:38 PM Wyoma Genson  MRN:  [id number]   Subjective:  The patient took Risperdal 2 mg last night and is complaining morning she feels too drowsy on the medication. Times spent reassuring the patient about adverse side effects and getting used to the medication. She continues to have some paranoid and delusional thoughts that staff is trying to poison her. She also is paranoid about returning home because she feels like her ex-boyfriend will find her. She remains resistant to medications and insight and judgment are poor. She denies any current active or passive suicidal thoughts or homicidal thoughts. She is denying any auditory or visual hallucinations. Vital signs are stable. She slept over 7 hours last night and appetite is good. She has attended some groups on the unit but does not feel like they are very helpful.   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 She has at least one inpatient hospitalization in 2015 in [place] for depression and SI.   Past medications: Celexa, Prozac, Lamictal, Lexparo, Abilify  Social History: Born in [place]. Lives in [place] with her father. She was recently in jail for 40 days for disorderly conduct. Difficult to get remaining history due to acute psychosis.      Principal Problem: Schizoaffective disorder (HCC) Diagnosis:   Patient Active Problem List   Diagnosis Date Noted  . Schizoaffective disorder (HCC) [F25.9] 03/31/2017  . Noncompliance [Z91.19] 03/30/2017  . Routine general medical examination at a health care facility [Z00.00] 11/11/2012   Total Time spent with patient: 20 minutes  Past Psychiatric History: See H&P  Past Medical History:  Past Medical History:  Diagnosis Date  . ADD (attention deficit disorder) 12/13/2013  . Adjustment disorder with mixed anxiety and depressed mood 12/13/2013  . Anxiety   . Bipolar 1 disorder, depressed, mild  (HCC) 12/13/2013  . Bipolar disorder (HCC)   . Current non-smoker but past smoking history unknown   . Depression   . Depression with suicidal ideation 10/08/2013  . Major depressive disorder, recurrent episode, severe, without mention of psychotic behavior 10/14/2013  . Major depressive disorder, single episode, severe, without mention of psychotic behavior 10/14/2013  . Suicidal ideations 10/14/2013    Past Surgical History:  Procedure Laterality Date  . TONSILLECTOMY    . WISDOM TOOTH EXTRACTION     Family History: History reviewed. No pertinent family history. Family Psychiatric  History: See H&P Social History:  Social History   Substance and Sexual Activity  Alcohol Use No  . 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 Marital status: Single    Spouse name: None  . Number of children: None  . Years of education: None  . Highest education level: None  Social Needs  . Financial resource strain: None  . Food insecurity - worry: None  . Food insecurity - inability: None  . Transportation needs - medical: None  . Transportation needs - non-medical: None  Occupational History  . None  Tobacco Use  . Smoking status: Former Games developer  . Smokeless tobacco: Never Used  Substance and Sexual Activity  . Alcohol use: No    Alcohol/week: 0.0 oz  . Drug use: No    Comment: Former Neurosurgeon  . Sexual activity: No  Other Topics Concern  . None  Social History  Narrative   ** Merged History Encounter **       Additional Social History:                         Sleep: Poor  Appetite:  Poor  Current Medications: Current Facility-Administered Medications  Medication Dose Route Frequency Provider Last Rate Last Dose  . acetaminophen (TYLENOL) tablet 650 mg  650 mg Oral Q6H PRN McNew, Ileene Hutchinson, MD      . alum & mag hydroxide-simeth (MAALOX/MYLANTA) 200-200-20  MG/5ML suspension 30 mL  30 mL Oral Q4H PRN McNew, Ileene Hutchinson, MD      . cloNIDine (CATAPRES) tablet 0.1 mg  0.1 mg Oral TID PRN McNew, Ileene Hutchinson, MD      . hydrOXYzine (ATARAX/VISTARIL) tablet 50 mg  50 mg Oral TID PRN McNew, Ileene Hutchinson, MD      . LORazepam (ATIVAN) tablet 1 mg  1 mg Oral Q6H PRN McNew, Holly R, MD      . magnesium hydroxide (MILK OF MAGNESIA) suspension 30 mL  30 mL Oral Daily PRN McNew, Holly R, MD      . OLANZapine (ZYPREXA) tablet 5 mg  5 mg Oral BID PRN McNew, Holly R, MD      . risperiDONE (RISPERDAL) tablet 2 mg  2 mg Oral QHS McNew, Ileene Hutchinson, MD   2 mg at 04/03/17 2141  . traZODone (DESYREL) tablet 100 mg  100 mg Oral QHS PRN McNew, Ileene Hutchinson, MD        Lab Results: No results found for this or any previous visit (from the past 48 hour(s)).  Blood Alcohol level:  Lab Results  Component Value Date   ETH <10 03/29/2017   ETH <11 10/14/2013    Metabolic Disorder Labs: Lab Results  Component Value Date   HGBA1C 5.3 10/25/2013   MPG 105 10/25/2013   No results found for: PROLACTIN Lab Results  Component Value Date   CHOL 140 10/25/2013   TRIG 140 10/25/2013   HDL 44 10/25/2013   CHOLHDL 3.2 10/25/2013   VLDL 28 10/25/2013   LDLCALC 68 10/25/2013    Physical Findings: AIMS: Facial and Oral Movements Muscles of Facial Expression: None, normal Lips and Perioral Area: None, normal Jaw: None, normal Tongue: None, normal,Extremity Movements Upper (arms, wrists, hands, fingers): None, normal Lower (legs, knees, ankles, toes): None, normal, Trunk Movements Neck, shoulders, hips: None, normal, Overall Severity Severity of abnormal movements (highest score from questions above): None, normal Incapacitation due to abnormal movements: None, normal Patient's awareness of abnormal movements (rate only patient's report): No Awareness, Dental Status Current problems with teeth and/or dentures?: No Does patient usually wear dentures?: No  CIWA:  CIWA-Ar Total: 2 COWS:   COWS Total Score: 4  Musculoskeletal: Strength & Muscle Tone: within normal limits Gait & Station: normal Patient leans: N/A  Psychiatric Specialty Exam: Physical Exam  Nursing note and vitals reviewed. Psychiatric: She has a normal mood and affect. Her speech is normal and behavior is normal. Judgment normal. Thought content is paranoid and delusional. Cognition and memory are normal.    Review of Systems  Constitutional: Negative.   HENT: Negative.   Eyes: Negative.   Respiratory: Negative.   Cardiovascular: Negative.   Gastrointestinal: Negative.   Skin: Negative.   Neurological: Negative.   Endo/Heme/Allergies: Negative.     Blood pressure 116/76, pulse (!) 111, temperature 98.2 F (36.8 C), resp. rate 18, height 5\' 3"  (1.6 m), weight 62.1  kg (137 lb), SpO2 99 %.Body mass index is 24.27 kg/m.  General Appearance: Disheveled  Eye Contact:  Minimal  Speech:  Clear and Coherent  Volume:  Normal  Mood:  Irritable  Affect:  "anxious"  Thought Process:  Disorganized  Orientation:  Full (Time, Place, and Person)  Thought Content:  Illogical, Delusions and Paranoid Ideation  Suicidal Thoughts:  No  Homicidal Thoughts:  No  Memory:  Immediate;   Fair  Judgement:  Impaired  Insight:  Poor  Psychomotor Activity:  Normal  Concentration:  Concentration: Fair and Attention Span: Fair  Recall:  F[employer]ervFair  Fund of Knowledge:  Fair  Language:  Fair  Akathisia:  No      Assets:  Resilience  ADL's:  Intact  Cognition:  WNL  Sleep:  Number of Hours: 7.15     Treatment Plan Summary: 22 yo female admitted due to psychosis. The patient continues to have paranoid and delusional thoughts that denying any auditory or visual hallucinations. No suicidal thoughts. She remains resistant to the idea of medications and has been taking the medication  Plan:  Schizoaffective disorder -Continue to offer oral Risperda 2 mg by mouth nightly l. Her mother will attempt to discuss with pt.  100  mg by mouth nightly for insomnia needed.- -Consider Gean BirchwoodInvega Sustenna if she tolerates Risperdal  Labs -Total cholesterol was 140 hemoglobin A1c was 5.3 -EKG showed QTC of 447  Dispo -She will likely stay with her grandparents in [place] per her mother.  She will need psychotropic medication management follow-up appointment after discharge.Darliss Ridgel-    Lakishia Bourassa K Hridaan Bouse, MD 04/04/2017, 1:38 PM

## 2017-04-04 NOTE — Progress Notes (Signed)
Hand off report received by Veronique, RN  Will finish shift monitoring patient for safety.  

## 2017-04-04 NOTE — [facility] Group Notes (Signed)
LCSW Group Therapy Note   04/04/2017 1:15pm   Type of Therapy and Topic:  Group Therapy:  Trust and Honesty  Participation Level:  Did Not Attend  Description of Group:    In this group patients will be asked to explore the value of being honest.  Patients will be guided to discuss their thoughts, feelings, and behaviors related to honesty and trusting in others. Patients will process together how trust and honesty relate to forming relationships with peers, family members, and self. Each patient will be challenged to identify and express feelings of being vulnerable. Patients will discuss reasons why people are dishonest and identify alternative outcomes if one was truthful (to self or others). This group will be process-oriented, with patients participating in exploration of their own experiences, giving and receiving support, and processing challenge from other group members.   Therapeutic Goals: 1. Patient will identify why honesty is important to relationships and how honesty overall affects relationships.  2. Patient will identify a situation where they lied or were lied too and the  feelings, thought process, and behaviors surrounding the situation 3. Patient will identify the meaning of being vulnerable, how that feels, and how that correlates to being honest with self and others. 4. Patient will identify situations where they could have told the truth, but instead lied and explain reasons of dishonesty.   Summary of Patient Progress    Therapeutic Modalities:   Cognitive Behavioral Therapy Solution Focused Therapy Motivational Interviewing Brief Therapy  Feliciana Narayan  CUEBAS-COLON, LCSW 04/04/2017 12:49 PM  

## 2017-04-04 NOTE — Plan of Care (Addendum)
Patient found in room upon my arrival. Patient is neither visible nor social this evening. Patient remains paranoid and guarded. Resistant to medication, "It makes me tired. I don't like how I feel the next day." Dr. Maryruth BunKapur spoke with patient and she then agreed to take her Risperdal. Appetite is poor. Denies SI, HI, AVH. Patient does not believe she needs to be here and is discharge focused. Q 15 minute checks maintained. Will continue to monitor throughout the shift.  Slept 7.25 hours. No apparent distress. Will endorse care to oncoming shift.    Progressing Education: Knowledge of the prescribed therapeutic regimen will improve 04/04/2017 2216 - Progressing by Galen ManilaVigil, Mirtha Jain E, RN Role Relationship: Ability to communicate needs accurately will improve 04/04/2017 2216 - Progressing by Galen ManilaVigil, Griff Badley E, RN   Not Progressing Self-Concept: Level of anxiety will decrease 04/04/2017 2216 - Not Progressing by Galen ManilaVigil, Ayan Heffington E, RN Coping: Level of anxiety will decrease 04/04/2017 2216 - Not Progressing by Galen ManilaVigil, Joanna Hall E, RN

## 2017-04-05 NOTE — Progress Notes (Signed)
[facility]  04/05/2017 3:35 PM Debbie KoyanagiMarie Elizabeth King  MRN:  [id number]   Subjective:  The patient was initially resistant to taking 2 mg of Risperdal last night but then eventually took the medication. She says it does still make her feel a little tired today but is willing to continue with the 2 mg of Risperdal. She has been paranoid and guarded but paranoid and delusional thoughts about staff trying to poison her have decreased. She denies any current active or passive suicidal thoughts. She denies any auditory or visual hallucinations. The patient did not attend groups yesterday but says that she went today. She has been coming out for meals but otherwise has been fairly as attentive to her room. Vital signs are stable. She slept over 7 hours last night. Other than feeling tired, she denies any physical adverse side effects associated with medication. She is willing to take oral Risperdal but remains resistant to twice a day of the long-acting injectable.   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 She has at least one inpatient hospitalization in 2015 in [place] for depression and SI.   Past medications: Celexa, Prozac, Lamictal, Lexparo, Abilify  Social History: Born in [place]. Lives in [place] with her father. She was recently in jail for 40 days for disorderly conduct. Difficult to get remaining history due to acute psychosis.      Principal Problem: Schizoaffective disorder (HCC) Diagnosis:   Patient Active Problem List   Diagnosis Date Noted  . Schizoaffective disorder (HCC) [F25.9] 03/31/2017  . Noncompliance [Z91.19] 03/30/2017  . Routine general medical examination at a health care facility [Z00.00] 11/11/2012   Total Time spent with patient: 20 minutes   Past Medical History:  Past Medical History:  Diagnosis Date  . ADD (attention deficit disorder) 12/13/2013  . Adjustment disorder with mixed anxiety  and depressed mood 12/13/2013  . Anxiety   . Bipolar 1 disorder, depressed, mild (HCC) 12/13/2013  . Bipolar disorder (HCC)   . Current non-smoker but past smoking history unknown   . Depression   . Depression with suicidal ideation 10/08/2013  . Major depressive disorder, recurrent episode, severe, without mention of psychotic behavior 10/14/2013  . Major depressive disorder, single episode, severe, without mention of psychotic behavior 10/14/2013  . Suicidal ideations 10/14/2013    Past Surgical History:  Procedure Laterality Date  . TONSILLECTOMY    . WISDOM TOOTH EXTRACTION     Family History: History reviewed. No pertinent family history. Family Psychiatric  History: See H&P Social History:  Social History   Substance and Sexual Activity  Alcohol Use No  . Alcohol/week: 0.0 oz     Social History   Substance and Sexual Activity  Drug Use No   Comment: Former NeurosurgeonUser    Social History   Socioeconomic History  . Marital status: Single    Spouse name: None  . Number of children: None  . Years of education: None  . Highest education level: None  Social Needs  . Financial resource strain: None  . Food insecurity - worry: None  . Food insecurity - inability: None  . Transportation needs - medical: None  . Transportation needs - non-medical: None  Occupational History  . None  Tobacco Use  . Smoking status: Former Games developermoker  . Smokeless tobacco: Never Used  Substance and Sexual Activity  . Alcohol use: No    Alcohol/week: 0.0 oz  . Drug use: No    Comment:  Former Neurosurgeon  . Sexual activity: No  Other Topics Concern  . None  Social History Narrative   ** Merged History Encounter **       Additional Social History:                         Sleep: Poor  Appetite:  Poor  Current Medications: Current Facility-Administered Medications  Medication Dose Route Frequency Provider Last Rate Last Dose  . acetaminophen (TYLENOL) tablet 650 mg  650 mg Oral Q6H PRN  McNew, Ileene Hutchinson, MD      . alum & mag hydroxide-simeth (MAALOX/MYLANTA) 200-200-20 MG/5ML suspension 30 mL  30 mL Oral Q4H PRN McNew, Ileene Hutchinson, MD      . cloNIDine (CATAPRES) tablet 0.1 mg  0.1 mg Oral TID PRN McNew, Ileene Hutchinson, MD      . hydrOXYzine (ATARAX/VISTARIL) tablet 50 mg  50 mg Oral TID PRN McNew, Ileene Hutchinson, MD      . LORazepam (ATIVAN) tablet 1 mg  1 mg Oral Q6H PRN McNew, Holly R, MD      . magnesium hydroxide (MILK OF MAGNESIA) suspension 30 mL  30 mL Oral Daily PRN McNew, Holly R, MD      . OLANZapine (ZYPREXA) tablet 5 mg  5 mg Oral BID PRN McNew, Holly R, MD      . risperiDONE (RISPERDAL) tablet 2 mg  2 mg Oral QHS McNew, Ileene Hutchinson, MD   2 mg at 04/04/17 2119  . traZODone (DESYREL) tablet 100 mg  100 mg Oral QHS PRN McNew, Ileene Hutchinson, MD        Lab Results: No results found for this or any previous visit (from the past 48 hour(s)).  Blood Alcohol level:  Lab Results  Component Value Date   ETH <10 03/29/2017   ETH <11 10/14/2013    Metabolic Disorder Labs: Lab Results  Component Value Date   HGBA1C 5.3 10/25/2013   MPG 105 10/25/2013   No results found for: PROLACTIN Lab Results  Component Value Date   CHOL 140 10/25/2013   TRIG 140 10/25/2013   HDL 44 10/25/2013   CHOLHDL 3.2 10/25/2013   VLDL 28 10/25/2013   LDLCALC 68 10/25/2013    Physical Findings: AIMS: Facial and Oral Movements Muscles of Facial Expression: None, normal Lips and Perioral Area: None, normal Jaw: None, normal Tongue: None, normal,Extremity Movements Upper (arms, wrists, hands, fingers): None, normal Lower (legs, knees, ankles, toes): None, normal, Trunk Movements Neck, shoulders, hips: None, normal, Overall Severity Severity of abnormal movements (highest score from questions above): None, normal Incapacitation due to abnormal movements: None, normal Patient's awareness of abnormal movements (rate only patient's report): No Awareness, Dental Status Current problems with teeth and/or  dentures?: No Does patient usually wear dentures?: No  CIWA:  CIWA-Ar Total: 2 COWS:  COWS Total Score: 4  Musculoskeletal: Strength & Muscle Tone: within normal limits Gait & Station: normal Patient leans: N/A  Psychiatric Specialty Exam: Physical Exam  Nursing note and vitals reviewed. Psychiatric: She has a normal mood and affect. Her speech is normal and behavior is normal. Judgment normal. Thought content is paranoid and delusional. Cognition and memory are normal.    Review of Systems  Constitutional: Negative.   HENT: Negative.   Eyes: Negative.   Respiratory: Negative.   Cardiovascular: Negative.   Gastrointestinal: Negative.   Skin: Negative.   Neurological: Negative.   Endo/Heme/Allergies: Negative.     Blood pressure 122/72, pulse Marland Kitchen)  120, temperature 98.7 F (37.1 C), temperature source Oral, resp. rate 18, height 5\' 3"  (1.6 m), weight 62.1 kg (137 lb), SpO2 100 %.Body mass index is 24.27 kg/m.  General Appearance: Disheveled  Eye Contact:  Minimal  Speech:  Clear and Coherent  Volume:  Normal  Mood:  "not good"  Affect:  "anxious"  Thought Process:  More goal directed; paranoid and delusional  Orientation:  Full (Time, Place, and Person)  Thought Content:  Illogical, Delusions and Paranoid Ideation  Suicidal Thoughts:  No  Homicidal Thoughts:  No  Memory:  Immediate;   Fair  Judgement:  Impaired  Insight:  Poor  Psychomotor Activity:  Normal  Concentration:  Concentration: Fair and Attention Span: Fair  Recall:  [employer] of Knowledge:  Fair  Language:  Fair  Akathisia:  No      Assets:  Communication Skills Housing Physical Health Resilience Social Support  ADL's:  Intact  Cognition:  WNL  Sleep:  Number of Hours: 7.25     Treatment Plan Summary: 22 yo female admitted due to psychosis. The patient continues to have paranoid and delusional thoughts that denying any auditory or visual hallucinations. No suicidal thoughts. She remains  resistant to the idea of medications and has been taking the medication  Plan:  Schizoaffective disorder -She is being compliant with Risperdal 2mg  po nightly.  -Continue Trazodone 100 mg by mouth nightly for insomnia needed.- -Consider Gean [place] if she tolerates Risperdal  Labs -Total cholesterol was 140 hemoglobin A1c was 5.3 -EKG showed QTC of 447  Dispo -She will likely stay with her grandparents in [place] per her mother.  She will need psychotropic medication management follow-up appointment after discharge.    Darliss Ridgel, MD 04/05/2017, 3:35 PM

## 2017-04-05 NOTE — Progress Notes (Signed)
D- Patient alert and oriented. Patient presents in a pleasant mood on assessment stating that "I don't feel good, but I don't want anything for it". Patient denies SI, HI, AVH, and pain at this time. Patient also denies any signs/symptoms of depression/anxiety a Patient has no stated goals for today, patient did voice to this writer that she just wanted to rest today.     A- Routine safety checks conducted every 15 minutes.  Patient informed to notify staff with problems or concerns.  R- Patient contracts for safety at this time. Patient compliant with treatment plan. Patient receptive, calm, and cooperative. Patient interacts well with others on the unit.  Patient remains safe at this time.

## 2017-04-05 NOTE — [facility] Group Notes (Signed)
LCSW Group Therapy Note 04/05/2017 1:15pm  Type of Therapy and Topic: Group Therapy: Feelings Around Returning Home & Establishing a Supportive Framework and Supporting Oneself When Supports Not Available  Participation Level: Minimal  Description of Group:  Patients first processed thoughts and feelings about upcoming discharge. These included fears of upcoming changes, lack of change, new living environments, judgements and expectations from others and overall stigma of mental health issues. The group then discussed the definition of a supportive framework, what that looks and feels like, and how do to discern it from an unhealthy non-supportive network. The group identified different types of supports as well as what to do when your family/friends are less than helpful or unavailable  Therapeutic Goals  1. Patient will identify one healthy supportive network that they can use at discharge. 2. Patient will identify one factor of a supportive framework and how to tell it from an unhealthy network. 3. Patient able to identify one coping skill to use when they do not have positive supports from others. 4. Patient will demonstrate ability to communicate their needs through discussion and/or role plays.  Summary of Patient Progress:  Pt reports she feels loopy. Pt engaged during group session. As patients processed their anxiety about discharge and described healthy supports patient shared that she is ready to go home. Pt reported that she recognizes that she cannot keep doing the same "stupid stuff", she stated she needs to be more honest and reach out to her support system more often. Pt indicated that her fears are not to be able to find a job and hanging around the wrong crowd.  Patients identified at least one self-care tool they were willing to use after discharge.   Therapeutic Modalities Cognitive Behavioral Therapy Motivational Interviewing   Ariela Mochizuki  CUEBAS-COLON, LCSW 04/05/2017 10:32  AM

## 2017-04-06 MED ORDER — PALIPERIDONE PALMITATE 234 MG/1.5ML IM SUSP
234.0000 mg | Freq: Once | INTRAMUSCULAR | Status: AC
  Administered 2017-04-06: 234 mg via INTRAMUSCULAR
  Filled 2017-04-06: qty 1.5

## 2017-04-06 MED ORDER — PALIPERIDONE PALMITATE 156 MG/ML IM SUSP
156.0000 mg | Freq: Once | INTRAMUSCULAR | Status: DC
  Filled 2017-04-06: qty 1

## 2017-04-06 NOTE — Progress Notes (Signed)
Patient was in the milieu, calm and cooperative. Alert and oriented and denying thoughts of self harm. Presented to the medication room and repeated that she does not want to take Risperdal 2mg , that the dose is too high for her "you guys wants to mess up my system, my liver is already weak, I can only take one pill...". Patient received her 2 pills (2mg ) but attempted to hide one under her tongue. Was encouraged to swallow all her medications and discuss her concern with MD in AM. Patient was able to swallow the entire dose and went to bed. Staff continue to monitor.
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
## 2017-04-06 NOTE — Tx Team (Signed)
Interdisciplinary Treatment and Diagnostic Plan Update  04/06/2017 Time of Session: [address] Debbie King MRN: [id number]  Principal Diagnosis: Schizoaffective disorder [facility])  Secondary Diagnoses: Principal Problem:   Schizoaffective disorder (HCC)   Current Medications:  Current Facility-Administered Medications  Medication Dose Route Frequency Provider Last Rate Last Dose  . acetaminophen (TYLENOL) tablet 650 mg  650 mg Oral Q6H PRN McNew, Ileene Hutchinson, MD      . alum & mag hydroxide-simeth (MAALOX/MYLANTA) 200-200-20 MG/5ML suspension 30 mL  30 mL Oral Q4H PRN McNew, Ileene Hutchinson, MD      . cloNIDine (CATAPRES) tablet 0.1 mg  0.1 mg Oral TID PRN McNew, Ileene Hutchinson, MD      . hydrOXYzine (ATARAX/VISTARIL) tablet 50 mg  50 mg Oral TID PRN McNew, Ileene Hutchinson, MD      . LORazepam (ATIVAN) tablet 1 mg  1 mg Oral Q6H PRN McNew, Ileene Hutchinson, MD      . magnesium hydroxide (MILK OF MAGNESIA) suspension 30 mL  30 mL Oral Daily PRN McNew, Ileene Hutchinson, MD      . OLANZapine (ZYPREXA) tablet 5 mg  5 mg Oral BID PRN Haskell Riling, MD      . Melene Muller ON 04/13/2017] paliperidone (INVEGA SUSTENNA) injection 156 mg  156 mg Intramuscular Once McNew, Holly R, MD      . paliperidone (INVEGA SUSTENNA) injection 234 mg  234 mg Intramuscular Once McNew, Holly R, MD      . risperiDONE (RISPERDAL) tablet 2 mg  2 mg Oral QHS McNew, Ileene Hutchinson, MD   2 mg at 04/05/17 2126  . traZODone (DESYREL) tablet 100 mg  100 mg Oral QHS PRN McNew, Ileene Hutchinson, MD       PTA Medications: No medications prior to admission.    Patient Stressors: Health problems Marital or family conflict Medication change or noncompliance Occupational concerns  Patient Strengths: Average or above average intelligence Capable of independent living General fund of knowledge Motivation for treatment/growth Supportive family/friends  Treatment Modalities: Medication Management, Group therapy, Case management,  1 to 1 session with clinician, Psychoeducation,  Recreational therapy.   Physician Treatment Plan for Primary Diagnosis: Schizoaffective disorder (HCC) Long Term Goal(s): Improvement in symptoms so as ready for discharge   Short Term Goals: Ability to verbalize feelings will improve Ability to disclose and discuss suicidal ideas  Medication Management: Evaluate patient's response, side effects, and tolerance of medication regimen.  Therapeutic Interventions: 1 to 1 sessions, Unit Group sessions and Medication administration.  Evaluation of Outcomes: Progressing  Physician Treatment Plan for Secondary Diagnosis: Principal Problem:   Schizoaffective disorder (HCC)  Long Term Goal(s): Improvement in symptoms so as ready for discharge   Short Term Goals: Ability to verbalize feelings will improve Ability to disclose and discuss suicidal ideas     Medication Management: Evaluate patient's response, side effects, and tolerance of medication regimen.  Therapeutic Interventions: 1 to 1 sessions, Unit Group sessions and Medication administration.  Evaluation of Outcomes: Progressing   RN Treatment Plan for Primary Diagnosis: Schizoaffective disorder (HCC) Long Term Goal(s): Knowledge of disease and therapeutic regimen to maintain health will improve  Short Term Goals: Ability to participate in decision making will improve, Ability to identify and develop effective coping behaviors will improve and Compliance with prescribed medications will improve  Medication Management: RN will administer medications as ordered by provider, will assess and evaluate patient's response and provide education to patient for prescribed medication. RN will report any adverse and/or side effects to prescribing  provider.  Therapeutic Interventions: 1 on 1 counseling sessions, Psychoeducation, Medication administration, Evaluate responses to treatment, Monitor vital signs and CBGs as ordered, Perform/monitor CIWA, COWS, AIMS and Fall Risk screenings as  ordered, Perform wound care treatments as ordered.  Evaluation of Outcomes: Progressing   LCSW Treatment Plan for Primary Diagnosis: Schizoaffective disorder (HCC) Long Term Goal(s): Safe transition to appropriate next level of care at discharge, Engage patient in therapeutic group addressing interpersonal concerns.  Short Term Goals: Engage patient in aftercare planning with referrals and resources, Increase emotional regulation and Increase skills for wellness and recovery  Therapeutic Interventions: Assess for all discharge needs, 1 to 1 time with Social worker, Explore available resources and support systems, Assess for adequacy in community support network, Educate family and significant other(s) on suicide prevention, Complete Psychosocial Assessment, Interpersonal group therapy.  Evaluation of Outcomes: Progressing   Progress in Treatment: Attending groups: No. Participating in groups: No. Taking medication as prescribed: Yes. Toleration medication: Yes. Family/Significant other contact made: Yes, individual(s) contacted:  pt's mother.  Patient understands diagnosis: Yes. Discussing patient identified problems/goals with staff: Yes. Medical problems stabilized or resolved: Yes. Denies suicidal/homicidal ideation: Yes. Issues/concerns per patient self-inventory: No. Other: None at this time.  New problem(s) identified: No, Describe:  none at this time.   New Short Term/Long Term Goal(s): Pt reported her goal for treatment is to, "find a medication that works and get happy."   Discharge Plan or Barriers: CSW will continue to assess for an appropriate discharge plan. At this time, pt will most likely discharge home and continue tx in the outpatient setting.   Reason for Continuation of Hospitalization: Delusions  Hallucinations Medication stabilization  Estimated Length of Stay: 5-7 days  Attendees: Patient:  04/01/2017 11:37 AM  Physician: Dr. Johnella MoloneyMcNew, MD 04/01/2017 11:37  AM  Nursing:Joanne Thompkins, RN 04/01/2017 11:37 AM  RN Care Manager: 04/01/2017 11:37 AM  Social Worker: Johny Shearsassandra Deane Wattenbarger, LCSWA 04/01/2017 11:37 AM  Recreational Therapist:  04/01/2017 11:37 AM  Other: Jake SharkSara Laws, LCSW 04/01/2017 11:37 AM  Other:  04/01/2017 11:37 AM  Other: 04/01/2017 11:37 AM    Scribe for Treatment Team: Johny Shearsassandra  Kristen Fromm, LCSW 04/06/2017 11:16 AM

## 2017-04-06 NOTE — Plan of Care (Signed)
Patient continues to complain that Risperdal 2mg  I a very high dose for her, trying to cheek her medications. Continues to complain saying that she needs to go

## 2017-04-07 NOTE — Plan of Care (Signed)
Patient reports that she slept good last night and had no complaints at this time. Patient has the ability to identify effective coping behavior. Patient denies any signs/symptoms of depression/anxiety at this time. Patient denies SI/HI/AVH at this time. Patient verbalizes understanding of her prescribed therapeutic regimen as well as the general information that has been provided to her and has not voiced any further questions or concerns at this time. Patient has the ability to demonstrate positive changes in her social behaviors as well as the ability to perform role responsibilities. Patient has the ability to manage health-related needs. Patient's risk for activity intolerance has decreased and patient has been maintaining adequate nutrition thus far. Patient has not experienced any health related complications thus far. Patient remains safe on the unit at this time.

## 2017-04-07 NOTE — B[facility] Group Notes (Signed)
  04/07/2017  Time: 0900  Type of Therapy and Topic:  Group Therapy:  Setting Goals Participation Level:  Did Not Attend  Description of Group: In this process group, patients discussed using strengths to work toward goals and address challenges.  Patients identified two positive things about themselves and one goal they were working on.  Patients were given the opportunity to share openly and support each other's plan for self-empowerment.  The group discussed the value of gratitude and were encouraged to have a daily reflection of positive characteristics or circumstances.  Patients were encouraged to identify a plan to utilize their strengths to work on current challenges and goals.  Therapeutic Goals 1. Patient will verbalize personal strengths/positive qualities and relate how these can assist with achieving desired personal goals 2. Patients will verbalize affirmation of peers plans for personal change and goal setting 3. Patients will explore the value of gratitude and positive focus as related to successful achievement of goals 4. Patients will verbalize a plan for regular reinforcement of personal positive qualities and circumstances.  Summary of Patient Progress: Pt was invited to attend group but chose not to attend. CSW will continue to encourage pt to attend group throughout their admission.    Therapeutic Modalities Cognitive Behavioral Therapy Motivational Interviewing  Sephora Boyar, MSW, LCSW 04/07/2017 9:39 AM  

## 2017-04-07 NOTE — Plan of Care (Addendum)
Patient found in common area upon my arrival. Patient is visible but not social this evening. Remains on the periphery, not interacting with peers all evening. Denies all complaints. Appetite remains poor, may be due to paranoia. Patient is minimally verbal during assessment. Remains guarded. Affect is full range. Patient has no scheduled HS medications. Compliant with unit routine and staff direction. Q 15 minute checks maintained. Will continue to monitor throughout the shift.  Slept 7 hours. No apparent distress. Will endorse care to oncoming shift.  @0600 , patient VS elevated. WNL upon recheck.   Progressing Self-Concept: Level of anxiety will decrease 04/07/2017 2328 - Progressing by Galen ManilaVigil, Petula Rotolo E, RN Ability to modify response to factors that promote anxiety will improve 04/07/2017 2328 - Progressing by Galen ManilaVigil, Rayya Yagi E, RN Education: Knowledge of the prescribed therapeutic regimen will improve 04/07/2017 2328 - Progressing by Galen ManilaVigil, Marquavius Scaife E, RN Role Relationship: Ability to communicate needs accurately will improve 04/07/2017 2328 - Progressing by Galen ManilaVigil, Jetaime Pinnix E, RN Activity: Sleeping patterns will improve 04/07/2017 2328 - Progressing by Galen ManilaVigil, Darren Caldron E, RN   Not Progressing Education: Knowledge of the prescribed therapeutic regimen will improve 04/07/2017 2328 - Not Progressing by Galen ManilaVigil, Pascha Fogal E, RN Nutrition: Adequate nutrition will be maintained 04/07/2017 2328 - Not Progressing by Galen ManilaVigil, Milton Streicher E, RN

## 2017-04-07 NOTE — Progress Notes (Signed)
Patient care taken over at 2300. Patient resting in bed quietly with no issues. Will continue to monitor.  

## 2017-04-07 NOTE — [facility] Group Notes (Signed)
04/07/2017 1PM  Type of Therapy/Topic:  Group Therapy:  Feelings about Diagnosis  Participation Level:  Did Not Attend   Description of Group:   This group will allow patients to explore their thoughts and feelings about diagnoses they have received. Patients will be guided to explore their level of understanding and acceptance of these diagnoses. Facilitator will encourage patients to process their thoughts and feelings about the reactions of others to their diagnosis and will guide patients in identifying ways to discuss their diagnosis with significant others in their lives. This group will be process-oriented, with patients participating in exploration of their own experiences, giving and receiving support, and processing challenge from other group members.   Therapeutic Goals: 1. Patient will demonstrate understanding of diagnosis as evidenced by identifying two or more symptoms of the disorder 2. Patient will be able to express two feelings regarding the diagnosis 3. Patient will demonstrate their ability to communicate their needs through discussion and/or role play  Summary of Patient Progress: Patient was encouraged and invited to attend group. Patient did not attend group. Social worker will continue to encourage group participation in the future.        Therapeutic Modalities:   Cognitive Behavioral Therapy Brief Therapy Feelings Identification    Nina Hoar, LCSW 04/07/2017 1:53 PM  

## 2017-04-07 NOTE — Progress Notes (Signed)
D- Patient alert and oriented. Patient presents in a pleasant mood on assessment stating she slept fair last night and did not request any sleeping medication. Patient denies SI, HI, AVH, and pain at this time. Patient also denies any signs/symptoms of depression and anxiety at this time. Patient's goal for today is "discharge plan", in which she will talk to the doctor in order to meet her goal.   A- Support and encouragement provided.  Routine safety checks conducted every 15 minutes.  Patient informed to notify staff with problems or concerns.  R- Patient contracts for safety at this time. Patient compliant with treatment plan. Patient receptive, calm, and cooperative. Patient interacts well with others on the unit.  Patient remains safe at this time.
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
## 2017-04-08 NOTE — Progress Notes (Addendum)
D: Patient stated slept good last night .Stated appetite is good and energy level  Is normal. Stated concentration is good . Stated on Depression scale ,0 hopeless 0 and anxiety 0.( low 0-10 high) Denies suicidal  homicidal ideations  .  No auditory hallucinations  No pain concerns . Appropriate ADL'S. Interacting with peers and staff.  A: Encourage patient participation with unit programming . Instruction  Given on  Medication , verbalize understanding. R: Voice no other concerns. Staff continue to monitor DNo group progression this shift . Limited interaction with peers  Coping skills sheet given to patient . Levels of anxiety has decreased .  No adl's , hair standing  all over her head . Limited interaction  with her peers . Thought process remains altered . Appetite good  . Compliant with  medication given . Patient remains to sleep most of shift  No  safety concerns voiced

## 2017-04-08 NOTE — [facility] Group Notes (Signed)

## 2017-04-08 NOTE — Plan of Care (Addendum)
Patient found in bed upon my arrival. Patient is visible intermittently but not social with peers. Patient remains on the periphery when in the day room or any common areas. Denies all complaints. Discharge focused. Continues to have a poor appetite. Voiding adequately. Patient has no scheduled medications @HS . Compliant with unit routine and staff direction. Q 15 minute checks maintained. Will continue to monitor throughout the shift.  Slept 6.5 hours. No apparent distress. Will endorse care to oncoming shift.   Progressing Self-Concept: Ability to identify factors that promote anxiety will improve 04/08/2017 2221 - Progressing by Galen ManilaVigil, Niel Peretti E, RN Level of anxiety will decrease 04/08/2017 2221 - Progressing by Galen ManilaVigil, Casson Catena E, RN Ability to modify response to factors that promote anxiety will improve 04/08/2017 2221 - Progressing by Galen ManilaVigil, Saad Buhl E, RN Education: Will be free of psychotic symptoms 04/08/2017 2221 - Progressing by Galen ManilaVigil, Jalisia Puchalski E, RN Knowledge of the prescribed therapeutic regimen will improve 04/08/2017 2221 - Progressing by Galen ManilaVigil, Toniyah Dilmore E, RN Education: Knowledge of the prescribed therapeutic regimen will improve 04/08/2017 2221 - Progressing by Galen ManilaVigil, Avya Flavell E, RN Coping: Level of anxiety will decrease 04/08/2017 2221 - Progressing by Galen ManilaVigil, Amadi Frady E, RN Activity: Sleeping patterns will improve 04/08/2017 2221 - Progressing by Galen ManilaVigil, Gabrelle Roca E, RN

## 2017-04-08 NOTE — Plan of Care (Signed)
  No group progression this shift . Limited interaction with peers  Coping skills sheet given to patient . Levels of anxiety has decreased .  No adl's , hair standing  all over her head . Limited interaction  with her peers . Thought process remains altered . Appetite good  . Compliant with  medication given . Patient remains to sleep most of shift  No  safety concerns voiced    Not Progressing Education: Ability to state activities that reduce stress will improve 04/08/2017 1527 - Not Progressing by Crist InfanteFarrish, Bryleigh Ottaway A, RN Coping: Ability to identify and develop effective coping behavior will improve 04/08/2017 1527 - Not Progressing by Crist InfanteFarrish, Cypress Hinkson A, RN Self-Concept: Ability to identify factors that promote anxiety will improve 04/08/2017 1527 - Not Progressing by Crist InfanteFarrish, Nonna Renninger A, RN Level of anxiety will decrease 04/08/2017 1527 - Not Progressing by Crist InfanteFarrish, Andric Kerce A, RN Ability to modify response to factors that promote anxiety will improve 04/08/2017 1527 - Not Progressing by Crist InfanteFarrish, Bell Cai A, RN Education: Will be free of psychotic symptoms 04/08/2017 1527 - Not Progressing by Crist InfanteFarrish, Jawann Urbani A, RN Knowledge of the prescribed therapeutic regimen will improve 04/08/2017 1527 - Not Progressing by Crist InfanteFarrish, Gergory Biello A, RN Nutritional: Ability to achieve adequate nutritional intake will improve 04/08/2017 1527 - Not Progressing by Crist InfanteFarrish, Taevyn Hausen A, RN Education: Knowledge of the prescribed therapeutic regimen will improve 04/08/2017 1527 - Not Progressing by Crist InfanteFarrish, Bonita Brindisi A, RN Role Relationship: Ability to communicate needs accurately will improve 04/08/2017 1527 - Not Progressing by Crist InfanteFarrish, Karrisa Didio A, RN Ability to demonstrate positive changes in social behaviors and relationships will improve 04/08/2017 1527 - Not Progressing by Crist InfanteFarrish, Deone Leifheit A, RN Ability to maintain and perform role responsibilities to the fullest extent possible will improve 04/08/2017 1527 - Not Progressing by Crist InfanteFarrish, Lynnann Knudsen A,  RN Activity: Interest or engagement in leisure activities will improve 04/08/2017 1527 - Not Progressing by Crist InfanteFarrish, Belva Koziel A, RN Imbalance in normal sleep/wake cycle will improve 04/08/2017 1527 - Not Progressing by Crist InfanteFarrish, Toi Stelly A, RN Education: Knowledge of General Education information will improve 04/08/2017 1527 - Not Progressing by Crist InfanteFarrish, Zerina Hallinan A, RN Health Behavior/Discharge Planning: Ability to manage health-related needs will improve 04/08/2017 1527 - Not Progressing by Crist InfanteFarrish, Kobie Matkins A, RN Activity: Risk for activity intolerance will decrease 04/08/2017 1527 - Not Progressing by Crist InfanteFarrish, Blake Vetrano A, RN Nutrition: Adequate nutrition will be maintained 04/08/2017 1527 - Not Progressing by Crist InfanteFarrish, Jaaziel Peatross A, RN Coping: Level of anxiety will decrease 04/08/2017 1527 - Not Progressing by Crist InfanteFarrish, Nelani Schmelzle A, RN Elimination: Will not experience complications related to bowel motility 04/08/2017 1527 - Not Progressing by Crist InfanteFarrish, Kinsly Hild A, RN Safety: Ability to remain free from injury will improve 04/08/2017 1527 - Not Progressing by Crist InfanteFarrish, Sehaj Mcenroe A, RN Activity: Sleeping patterns will improve 04/08/2017 1527 - Not Progressing by Crist InfanteFarrish, Katalyn Matin A, RN

## 2017-04-09 MED ORDER — PALIPERIDONE PALMITATE 234 MG/1.5ML IM SUSP: 234.0000 mg | Freq: Once | INTRAMUSCULAR | 0 refills | Status: DC

## 2017-04-09 MED ORDER — PALIPERIDONE PALMITATE 156 MG/ML IM SUSP
156.0000 mg | Freq: Once | INTRAMUSCULAR | Status: AC
  Administered 2017-04-10: 156 mg via INTRAMUSCULAR
  Filled 2017-04-09: qty 1

## 2017-04-09 MED ORDER — PALIPERIDONE PALMITATE 234 MG/1.5ML IM SUSP: 234.0000 mg | Freq: Once | INTRAMUSCULAR | Status: DC

## 2017-04-09 NOTE — Plan of Care (Addendum)
Patient found awake in bed upon my arrival. Patient is neither visible nor social this evening. Denies all complaints. Believes she is ready for discharge tomorrow. Appetite is poor but patient reports she doesn't eat a lot. Voiding adequately. Cooperative with assessment. Affect and mood improved. No scheduled HS medications. Cooperative with staff direction. Q 15 minute checks maintained. Will continue to monitor throughout the shift.  Slept 6.25 hours. No apparent distress. Will endorse care to oncoming shift. Progressing Self-Concept: Ability to identify factors that promote anxiety will improve 04/09/2017 2057 - Progressing by Galen ManilaVigil, Esaw Knippel E, RN Level of anxiety will decrease 04/09/2017 2057 - Progressing by Galen ManilaVigil, Kahiau Schewe E, RN Ability to modify response to factors that promote anxiety will improve 04/09/2017 2057 - Progressing by Galen ManilaVigil, Viaan Knippenberg E, RN Education: Will be free of psychotic symptoms 04/09/2017 2057 - Progressing by Galen ManilaVigil, Adaiah Jaskot E, RN Knowledge of the prescribed therapeutic regimen will improve 04/09/2017 2057 - Progressing by Galen ManilaVigil, Zian Mohamed E, RN

## 2017-04-09 NOTE — Progress Notes (Signed)
[facility] MD Progress Note  04/09/2017 11:16 AM Debbie King  MRN:  [id number]   Subjective:  Per RN staff, pt stated that she was not going to take 2nd dose of Invega tomorrow. Met with patient today. She states that she never said that and plans to get it tomorrow. She also still plans to follow up with [facility] and she signed the form to get medication assistance. She does not have any paranoid or delusional thought content this morning. When asked, she denies SI or any thoughts of self harm. She denies HI. She feels tired today. She slept well last night. She was observed yesterday outside and in the milieu with peers and acting very appropriately. She is organized in conversation. She states taht she has been eating "when I feel like eating. I eat a lot of snacks."   Principal Problem: Schizoaffective disorder ([place]) Diagnosis:   Patient Active Problem List   Diagnosis Date Noted  . Schizoaffective disorder ([place]) [F25.9] 03/31/2017    Priority: High  . Noncompliance [Z91.19] 03/30/2017  . Routine general medical examination at a health care facility [Z00.00] 11/11/2012   Total Time spent with patient: 15 minutes  Past Psychiatric History: See H&P  Past Medical History:  Past Medical History:  Diagnosis Date  . ADD (attention deficit disorder) 12/13/2013  . Adjustment disorder with mixed anxiety and depressed mood 12/13/2013  . Anxiety   . Bipolar 1 disorder, depressed, mild ([place]) 12/13/2013  . Bipolar disorder ([place])   . Current non-smoker but past smoking history unknown   . Depression   . Depression with suicidal ideation 10/08/2013  . Major depressive disorder, recurrent episode, severe, without mention of psychotic behavior 10/14/2013  . Major depressive disorder, single episode, severe, without mention of psychotic behavior 10/14/2013  . Suicidal ideations 10/14/2013    Past Surgical History:  Procedure Laterality Date  . TONSILLECTOMY    . WISDOM TOOTH EXTRACTION     Family  History: History reviewed. No pertinent family history. Family Psychiatric  History: See H&P Social History:  Social History   Substance and Sexual Activity  Alcohol Use No  . Alcohol/week: 0.0 oz     Social History   Substance and Sexual Activity  Drug Use No   Comment: Former Systems developer    Social History   Socioeconomic History  . Marital status: Single    Spouse name: None  . Number of children: None  . Years of education: None  . Highest education level: None  Social Needs  . Financial resource strain: None  . Food insecurity - worry: None  . Food insecurity - inability: None  . Transportation needs - medical: None  . Transportation needs - non-medical: None  Occupational History  . None  Tobacco Use  . Smoking status: Former Research scientist (life sciences)  . Smokeless tobacco: Never Used  Substance and Sexual Activity  . Alcohol use: No    Alcohol/week: 0.0 oz  . Drug use: No    Comment: Former Systems developer  . Sexual activity: No  Other Topics Concern  . None  Social History Narrative   ** Merged History Encounter **       Additional Social History:                         Sleep: Good  Appetite:  Fair  Current Medications: Current Facility-Administered Medications  Medication Dose Route Frequency Provider Last Rate Last Dose  . acetaminophen (TYLENOL) tablet 650 mg  650  mg Oral Q6H PRN Haston Casebolt, Tyson Babinski, MD      . alum & mag hydroxide-simeth (MAALOX/MYLANTA) 200-200-20 MG/5ML suspension 30 mL  30 mL Oral Q4H PRN Jaasiel [place]field, Tyson Babinski, MD      . cloNIDine (CATAPRES) tablet 0.1 mg  0.1 mg Oral TID PRN Erdem Naas, Tyson Babinski, MD      . hydrOXYzine (ATARAX/VISTARIL) tablet 50 mg  50 mg Oral TID PRN Larcenia Holaday, Tyson Babinski, MD      . LORazepam (ATIVAN) tablet 1 mg  1 mg Oral Q6H PRN Riverlyn Kizziah, Tyson Babinski, MD      . magnesium hydroxide (MILK OF MAGNESIA) suspension 30 mL  30 mL Oral Daily PRN Neyla Gauntt, Tyson Babinski, MD      . OLANZapine (ZYPREXA) tablet 5 mg  5 mg Oral BID PRN Marylin Crosby, MD      . Derrill Memo ON 04/13/2017]  paliperidone (INVEGA SUSTENNA) injection 156 mg  156 mg Intramuscular Once Sherian Valenza, Tyson Babinski, MD      . traZODone (DESYREL) tablet 100 mg  100 mg Oral QHS PRN Yuvan Medinger, Tyson Babinski, MD        Lab Results: No results found for this or any previous visit (from the past 60 hour(s)).  Blood Alcohol level:  Lab Results  Component Value Date   ETH <10 03/29/2017   ETH <11 35/59/7416    Metabolic Disorder Labs: Lab Results  Component Value Date   HGBA1C 5.3 10/25/2013   MPG 105 10/25/2013   No results found for: PROLACTIN Lab Results  Component Value Date   CHOL 140 10/25/2013   TRIG 140 10/25/2013   HDL 44 10/25/2013   CHOLHDL 3.2 10/25/2013   VLDL 28 10/25/2013   LDLCALC 68 10/25/2013    Physical Findings: AIMS: Facial and Oral Movements Muscles of Facial Expression: None, normal Lips and Perioral Area: None, normal Jaw: None, normal Tongue: None, normal,Extremity Movements Upper (arms, wrists, hands, fingers): None, normal Lower (legs, knees, ankles, toes): None, normal, Trunk Movements Neck, shoulders, hips: None, normal, Overall Severity Severity of abnormal movements (highest score from questions above): None, normal Incapacitation due to abnormal movements: None, normal Patient's awareness of abnormal movements (rate only patient's report): No Awareness, Dental Status Current problems with teeth and/or dentures?: No Does patient usually wear dentures?: No  CIWA:  CIWA-Ar Total: 2 COWS:  COWS Total Score: 4  Musculoskeletal: Strength & Muscle Tone: within normal limits Gait & Station: normal Patient leans: N/A  Psychiatric Specialty Exam: Physical Exam  Nursing note and vitals reviewed.   Review of Systems  All other systems reviewed and are negative.   Blood pressure 100/65, pulse 75, temperature 98 F (36.7 C), temperature source Oral, resp. rate 18, height '5\' 3"'  (1.6 m), weight 62.1 kg (137 lb), SpO2 100 %.Body mass index is 24.27 kg/m.  General Appearance:  Disheveled  Eye Contact:  Minimal  Speech:  Clear and Coherent  Volume:  Normal  Mood:  Euthymic  Affect:  Congruent  Thought Process:  Coherent and Goal Directed  Orientation:  Full (Time, Place, and Person)  Thought Content:  Logical  Suicidal Thoughts:  No  Homicidal Thoughts:  No  Memory:  Recent;   Fair  Judgement:  Impaired  Insight:  Lacking  Psychomotor Activity:  Normal  Concentration:  Concentration: Fair  Recall:  [employer] of Knowledge:  Fair  Language:  Fair  Akathisia:  No      Assets:  Resilience  ADL's:  Intact  Cognition:  WNL  Sleep:  Number of Hours: 6.5     Treatment Plan Summary: 22 yo female admitted due to psychosis and paranoia. Paranoia is significantly improved since admission. She is much calmer on the unit and much less paranoid about staff. She is organized and goal directed in conversation. Denies SI or HI. She will receive second initial injection of [place] tomorrow which she agrees to.   Plan: Schizoaffective disorder -Pt was given initial dose of Invega Sustenna 234 mgyesterday -Will give 2nd dose of 156 mg on Friday  Labs -Total cholesterol was 140 hemoglobin A1c was 5.3 -EKG showed QTC of 447  Dispo -She will stay at her grandparents home (they are deceased but her aunt lives there). She will follow up with medication management appointment in [place], MD 04/09/2017, 11:16 AM

## 2017-04-09 NOTE — Plan of Care (Signed)
Patient does not engage in conversation with staff. Up for meals and medications, patient otherwise in bed resting with eyes closed. Denies pain, SI/HI/AVH at this time. Compliant with meals and medications, when up in the milieu no peer to peer interaction noted. No distress noted at this time. Milieu remains safe at this time. Will continue to monitor.   

## 2017-04-09 NOTE — Progress Notes (Signed)
Patient does not engage in conversation with staff. Up for meals and medications, patient otherwise in bed resting with eyes closed. Denies pain, SI/HI/AVH at this time. Compliant with meals and medications, when up in the milieu no peer to peer interaction noted. No distress noted at this time. Milieu remains safe at this time. Will continue to monitor.

## 2017-04-09 NOTE — [facility] Group Notes (Signed)
  04/09/2017 9am  Type of Therapy and Topic: Group Therapy: Goals Group: SMART Goals   Participation Level: Did Not Attend  Description of Group:    The purpose of a daily goals group is to assist and guide patients in setting recovery/wellness-related goals. The objective is to set goals as they relate to the crisis in which they were admitted. Patients will be using SMART goal modalities to set measurable goals. Characteristics of realistic goals will be discussed and patients will be assisted in setting and processing how one will reach their goal. Facilitator will also assist patients in applying interventions and coping skills learned in psycho-education groups to the SMART goal and process how one will achieve defined goal.   Therapeutic Goals:   -Patients will develop and document one goal related to or their crisis in which brought them into treatment.  -Patients will be guided by LCSW using SMART goal setting modality in how to set a measurable, attainable, realistic and time sensitive goal.  -Patients will process barriers in reaching goal.  -Patients will process interventions in how to overcome and successful in reaching goal.   Patient's Goal: Patient was encouraged and invited to attend group. Patient did not attend group. Social worker will continue to encourage group participation in the future.    Therapeutic Modalities:  Motivational Interviewing  Cognitive Behavioral Therapy  Crisis Intervention Model  SMART goals setting  [name], LCSW 04/09/2017 9:45 AM   

## 2017-04-09 NOTE — [facility] Group Notes (Signed)
04/09/2017  Time: 1:00PM  Type of Therapy/Topic:  Group Therapy:  Balance in Life  Participation Level:  Active  Description of Group:   This group will address the concept of balance and how it feels and looks when one is unbalanced. Patients will be encouraged to process areas in their lives that are out of balance and identify reasons for remaining unbalanced. Facilitators will guide patients in utilizing problem-solving interventions to address and correct the stressor making their life unbalanced. Understanding and applying boundaries will be explored and addressed for obtaining and maintaining a balanced life. Patients will be encouraged to explore ways to assertively make their unbalanced needs known to significant others in their lives, using other group members and facilitator for support and feedback.  Therapeutic Goals: 1. Patient will identify two or more emotions or situations they have that consume much of in their lives. 2. Patient will identify signs/triggers that life has become out of balance:  3. Patient will identify two ways to set boundaries in order to achieve balance in their lives:  4. Patient will demonstrate ability to communicate their needs through discussion and/or role plays  Summary of Patient Progress: Pt continues to work towards their tx goals but has not yet reached them. Pt was able to appropriately participate in group discussion, and was able to offer support/validation to other group members. Pt reported feeling, "really tired ever since they gave me a shot a few days ago." pt reported one area of her life she would like to devote more attention to is her, "mental health." Pt reported an area of her life that she devotes too much attention to is, "my family." Pt reported one way she can maintain a better life balance is to, "set boundaries."   Therapeutic Modalities:   Cognitive Behavioral Therapy Solution-Focused Therapy Assertiveness Training  Heidi DachKelsey  Debbie King, MSW, LCSW 04/09/2017 1:57 PM
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
## 2017-04-10 NOTE — Progress Notes (Signed)
Patient denies SI/HI, denies A/V hallucinations. Patient verbalizes understanding of discharge instructions, follow up care. Patient given all belongings from  locker. Patient escorted out by staff, transported by family. 

## 2017-04-10 NOTE — [facility] Group Notes (Signed)
[facility] Group Notes:  (Nursing/MHT/Case Management/Adjunct)  Date:  04/10/2017  Time:  12:38 AM  Type of Therapy:  Group Therapy  Participation Level:  Active  Participation Quality:  Appropriate  Affect:  Appropriate  Cognitive:  Alert  Insight:  Good  Engagement in Group:  Engaged  Modes of Intervention:  Support  Summary of Progress/Problems:  Debbie King 04/10/2017, 12:38 AM
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
## 2017-04-10 NOTE — Progress Notes (Addendum)
  [facility] :  Will you be returning to the same living situation after discharge:  No. At discharge, do you have transportation home?: Yes,  pt's mother, Westley HummerCindy Iglehart. Do you have the ability to pay for your medications: Yes,  Medication management clinic.   Release of information consent forms completed and in the chart;  Patient's signature needed at discharge.  Patient to Follow up at: Follow-up Information    [facility].   Why:  Please go to your hospital follow up on Monday, 04/13/17 at 12PM. Thank you!  Contact information: [address] [place] [address] [phone]           Next level of care provider [facility]:no  Safety Planning and Suicide Prevention discussed: Yes,  with pt's and her mother.   Have you used any form of tobacco in the last 30 days? (Cigarettes, Smokeless Tobacco, Cigars, and/or Pipes): No  Has patient been referred to the Quitline?: N/A patient is not a smoker  Patient has been referred for addiction treatment: N/A  Heidi DachKelsey Marquan Vokes, LCSW 04/10/2017, 8:31 AM

## 2017-04-25 ENCOUNTER — Encounter: Payer: Self-pay | Admitting: *Deleted

## 2017-04-25 ENCOUNTER — Emergency Department
Admission: EM | Admit: 2017-04-25 | Discharge: 2017-04-25 | Disposition: A | Discharge status: Home / Self Care | Payer: Medicaid Other | Attending: Emergency Medicine | Admitting: Emergency Medicine

## 2017-04-25 ENCOUNTER — Other Ambulatory Visit: Payer: Self-pay

## 2017-04-25 DIAGNOSIS — Z046 Encounter for general psychiatric examination, requested by authority: Secondary | ICD-10-CM | POA: Insufficient documentation

## 2017-04-25 DIAGNOSIS — R45851 Suicidal ideations: Secondary | ICD-10-CM | POA: Diagnosis not present

## 2017-04-25 DIAGNOSIS — Z87891 Personal history of nicotine dependence: Secondary | ICD-10-CM | POA: Diagnosis not present

## 2017-04-25 DIAGNOSIS — Z79899 Other long term (current) drug therapy: Secondary | ICD-10-CM | POA: Insufficient documentation

## 2017-04-25 DIAGNOSIS — F329 Major depressive disorder, single episode, unspecified: Secondary | ICD-10-CM | POA: Diagnosis present

## 2017-04-25 DIAGNOSIS — F209 Schizophrenia, unspecified: Secondary | ICD-10-CM | POA: Diagnosis not present

## 2017-04-25 LAB — COMPREHENSIVE METABOLIC PANEL
ALBUMIN: 4.3 g/dL (ref 3.5–5.0)
ALK PHOS: 59 U/L (ref 38–126)
ALT: 12 U/L — AB (ref 14–54)
AST: 17 U/L (ref 15–41)
Anion gap: 9 (ref 5–15)
BILIRUBIN TOTAL: 0.5 mg/dL (ref 0.3–1.2)
BUN: 8 mg/dL (ref 6–20)
CO2: 26 mmol/L (ref 22–32)
CREATININE: 0.77 mg/dL (ref 0.44–1.00)
Calcium: 9.3 mg/dL (ref 8.9–10.3)
Chloride: 106 mmol/L (ref 101–111)
GFR calc Af Amer: >60 mL/min (ref >60)
GLUCOSE: 90 mg/dL (ref 65–99)
Potassium: 3.6 mmol/L (ref 3.5–5.1)
Sodium: 141 mmol/L (ref 135–145)
TOTAL PROTEIN: 7 g/dL (ref 6.5–8.1)

## 2017-04-25 LAB — CBC
HEMATOCRIT: 41.7 % (ref 35.0–47.0)
Hemoglobin: 14.2 g/dL (ref 12.0–16.0)
MCH: 30.6 pg (ref 26.0–34.0)
MCHC: 34 g/dL (ref 32.0–36.0)
MCV: 89.9 fL (ref 80.0–100.0)
PLATELETS: 267 10*3/uL (ref 150–440)
RBC: 4.64 MIL/uL (ref 3.80–5.20)
RDW: 13.3 % (ref 11.5–14.5)
WBC: 6.6 10*3/uL (ref 3.6–11.0)

## 2017-04-25 LAB — URINE DRUG SCREEN, QUALITATIVE (ARMC ONLY)
AMPHETAMINES, UR SCREEN: NOT DETECTED
Barbiturates, Ur Screen: NOT DETECTED
Benzodiazepine, Ur Scrn: NOT DETECTED
CANNABINOID 50 NG, UR ~~LOC~~: NOT DETECTED
Cocaine Metabolite,Ur ~~LOC~~: NOT DETECTED
MDMA (ECSTASY) UR SCREEN: NOT DETECTED
Methadone Scn, Ur: NOT DETECTED
OPIATE, UR SCREEN: NOT DETECTED
PHENCYCLIDINE (PCP) UR S: NOT DETECTED
Tricyclic, Ur Screen: NOT DETECTED

## 2017-04-25 LAB — SALICYLATE LEVEL: Salicylate Lvl: <7 mg/dL (ref 2.8–30.0)

## 2017-04-25 LAB — PREGNANCY, URINE: Preg Test, Ur: NEGATIVE

## 2017-04-25 LAB — ACETAMINOPHEN LEVEL: Acetaminophen (Tylenol), Serum: <10 ug/mL — ABNORMAL LOW (ref 10–30)

## 2017-04-25 LAB — ETHANOL

## 2017-04-25 NOTE — ED Notes (Signed)
TTS at bedside. 

## 2017-04-25 NOTE — ED Notes (Signed)
SOC in progress.  

## 2017-04-25 NOTE — ED Triage Notes (Signed)
Pt to ED reporting SI for the past two weeks after being placed on invega. Pt reports a panic attack 2 weeks ago. Pt also reporting her life has been going downhill since she was taken off her ADHD medication and is requesting to be placed back on ADHD medications. No HI or hallucinations. No drug use. No trouble sleeping. No attempted suicide and no self harm noted. Pt calm a cooperative.

## 2017-04-25 NOTE — BH Assessment (Signed)
Assessment Note  Debbie KoyanagiMarie Elizabeth King is an 22 y.o. female. Patient presented to [facility] voluntarily with her mother due to suicidal ideation and depression. Patient stated she has been without her ADHD medication for 3 years, which has caused her life to spiral out of control. Patient stated she has acquired the following charges due to not having her ADHD medication: prostitution, theft, disorderly conduct, and resisting arrest. In addition, patient stated she is unable to become gainfully employed due to not having her ADHD medication. Patient reported she last went to [facility] in [place], but didn't get along with the psychiatrist because he wouldn't prescribe her ADHD medication. Patient endorsed suicidal thoughts because she does not have ADHD medication. Patient denied HI and AVH. Patient was admitted to [facility] in 03/2017 due to psychosis. Patient denied illegal substance use.   Diagnosis: Depression  Past Medical History:  Past Medical History:  Diagnosis Date  . ADD (attention deficit disorder) 12/13/2013  . Adjustment disorder with mixed anxiety and depressed mood 12/13/2013  . Anxiety   . Bipolar 1 disorder, depressed, mild (HCC) 12/13/2013  . Bipolar disorder (HCC)   . Current non-smoker but past smoking history unknown   . Depression   . Depression with suicidal ideation 10/08/2013  . Major depressive disorder, recurrent episode, severe, without mention of psychotic behavior 10/14/2013  . Major depressive disorder, single episode, severe, without mention of psychotic behavior 10/14/2013  . Suicidal ideations 10/14/2013    Past Surgical History:  Procedure Laterality Date  . TONSILLECTOMY    . WISDOM TOOTH EXTRACTION      Family History: History reviewed. No pertinent family history.  Social History:  reports that she has quit smoking. She has never used smokeless tobacco. She reports that she does not drink alcohol or use drugs.  Additional Social History:  Alcohol / Drug  Use Pain Medications: SEE PTA  Prescriptions: SEE PTA  Over the Counter: SEE PTA  History of alcohol / drug use?: No history of alcohol / drug abuse Longest period of sobriety (when/how long): None reported   CIWA: CIWA-Ar BP: 113/78 Pulse Rate: (!) 104 COWS:    Allergies:  Allergies  Allergen Reactions  . Citalopram Itching  . Haldol [Haloperidol Lactate] Other (See Comments)    Something happened to jaw     Home Medications:  (Not in a hospital admission)  OB/GYN Status:  No LMP recorded.  General Assessment Data Assessment unable to be completed: (SEE ASSESSMENT ) Location of Assessment: [facility] ED TTS Assessment: In system Is this a Tele or Face-to-Face Assessment?: Face-to-Face Is this an Initial Assessment or a Re-assessment for this encounter?: Initial Assessment Marital status: Single Maiden name: N/A Is patient pregnant?: No Pregnancy Status: No Living Arrangements: Parent Can pt return to current living arrangement?: Yes Admission Status: Voluntary Is patient capable of signing voluntary admission?: Yes Referral Source: Self/Family/Friend Insurance type: Research officer, [employer]  Medical Screening Exam ([facility] Walk-in ONLY) Medical Exam completed: Yes  Crisis Care Plan Living Arrangements: Parent Legal Guardian: Other:(None reported ) Name of Psychiatrist: None reported  Name of Therapist: None reported   Education Status Is patient currently in school?: No Current Grade: N/A Highest grade of school patient has completed: Cosmetology  Name of school: N/A Contact person: N/A Is the patient employed, unemployed or receiving disability?: Unemployed  Risk to self with the past 6 months Suicidal Ideation: Yes-Currently Present Has patient been a risk to self within the past 6 months prior to admission? : No Suicidal Intent: No  Has patient had any suicidal intent within the past 6 months prior to admission? : No Is patient at risk for suicide?: No Suicidal  Plan?: No Has patient had any suicidal plan within the past 6 months prior to admission? : No Access to Means: No What has been your use of drugs/alcohol within the last 12 months?: None reported  Previous Attempts/Gestures: No How many times?: 0 Other Self Harm Risks: None reported  Triggers for Past Attempts: Unpredictable Intentional Self Injurious Behavior: None Family Suicide History: No Recent stressful life event(s): Other (Comment) Persecutory voices/beliefs?: No Depression: Yes Depression Symptoms: Loss of interest in usual pleasures Substance abuse history and/or treatment for substance abuse?: No Suicide prevention information given to non-admitted patients: Not applicable  Risk to Others within the past 6 months Homicidal Ideation: No-Not Currently/Within Last 6 Months Does patient have any lifetime risk of violence toward others beyond the six months prior to admission? : No Thoughts of Harm to Others: No Current Homicidal Intent: No Current Homicidal Plan: No Access to Homicidal Means: No Identified Victim: None reported  History of harm to others?: No Assessment of Violence: None Noted Violent Behavior Description: None reported  Does patient have access to weapons?: No Criminal Charges Pending?: No Does patient have a court date: No Is patient on probation?: No  Psychosis Hallucinations: None noted Delusions: None noted  Mental Status Report Appearance/Hygiene: Disheveled, In scrubs Eye Contact: Fair Motor Activity: Unremarkable Speech: Soft Level of Consciousness: Alert Mood: Depressed Affect: Depressed Anxiety Level: None Thought Processes: Coherent Judgement: Impaired Orientation: Person, Place, Time, Situation, Appropriate for developmental age Obsessive Compulsive Thoughts/Behaviors: None  Cognitive Functioning Concentration: Good Memory: Recent Intact, Remote Intact Is patient IDD: No Is patient DD?: No Insight: Fair Impulse Control:  Fair Appetite: Good Have you had any weight changes? : No Change Sleep: No Change Total Hours of Sleep: 8 Vegetative Symptoms: None  ADLScreening [facility] Assessment Services) Patient's cognitive ability adequate to safely complete daily activities?: Yes Patient able to express need for assistance with ADLs?: Yes  Prior Inpatient Therapy Prior Inpatient Therapy: Yes Prior Therapy Dates: 03/20/2017 Prior Therapy Facilty/Provider(s): [facility] Reason for Treatment: Psychosis  Prior Outpatient Therapy Prior Outpatient Therapy: No Does patient have an ACCT team?: No Does patient have Intensive In-House Services?  : No Does patient have [facility] services? : No Does patient have P4CC services?: No  ADL Screening (condition at time of admission) Patient's cognitive ability adequate to safely complete daily activities?: Yes Is the patient deaf or have difficulty hearing?: No Does the patient have difficulty seeing, even when wearing glasses/contacts?: No Does the patient have difficulty concentrating, remembering, or making decisions?: No Patient able to express need for assistance with ADLs?: Yes Does the patient have difficulty dressing or bathing?: No Does the patient have difficulty walking or climbing stairs?: No Weakness of Legs: None Weakness of Arms/Hands: None  Home Assistive Devices/Equipment Home Assistive Devices/Equipment: None  Therapy Consults (therapy consults require a physician order) PT Evaluation Needed: No OT Evalulation Needed: No SLP Evaluation Needed: No            Additional Information 1:1 In Past 12 Months?: No CIRT Risk: No Elopement Risk: No Does patient have medical clearance?: Yes     Disposition:     On Site Evaluation by:   Reviewed with Physician:    Otilio Groleau L Coumba Kellison, LPCA, LCASA 04/25/2017 4:24 PM

## 2017-04-25 NOTE — Discharge Instructions (Addendum)

## 2017-04-25 NOTE — ED Notes (Signed)
Pt denies SI/HI/AVH. Pt given discharge instructions including f/u appointments. Pt states understanding. Pt states receipt of all belongings.   
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
## 2017-04-25 NOTE — ED Notes (Signed)
ED Provider at bedside. 

## 2017-04-25 NOTE — ED Provider Notes (Signed)
Patient has been seen by Dr. Chipper HerbZhang of psychiatry.  Recommends discharge with no further immediate treatment changes, but does recommend outpatient follow-up.  Patient reports having seen outpatient psychiatrist whom she can reach out to, recommended she follow-up Monday.     Sharyn CreamerQuale, Donye Campanelli, MD 04/25/17 [phone]

## 2017-04-25 NOTE — Care Management Note (Signed)
Upon interviewing patient, she repeatedly brought up the subject of getting back on ADHD medication so she can hold a job, among other things. She reports having been on INTUNIV while in high school and found it an effective therapy. I informed her that I personally did not have that authority, but promised I would include her desires in her chart for providers to review. No recommendations or evaluative remarks made.

## 2017-05-13 ENCOUNTER — Encounter: Payer: Self-pay | Admitting: Psychiatry

## 2017-05-13 ENCOUNTER — Other Ambulatory Visit: Payer: Self-pay

## 2017-05-13 ENCOUNTER — Inpatient Hospital Stay
Admission: AD | Admit: 2017-05-13 | Discharge: 2017-05-18 | DRG: 885 | Disposition: A | Discharge status: Home / Self Care | Payer: No Typology Code available for payment source | Attending: Psychiatry | Admitting: Psychiatry

## 2017-05-13 DIAGNOSIS — Z79899 Other long term (current) drug therapy: Secondary | ICD-10-CM | POA: Diagnosis not present

## 2017-05-13 DIAGNOSIS — F250 Schizoaffective disorder, bipolar type: Secondary | ICD-10-CM | POA: Diagnosis present

## 2017-05-13 DIAGNOSIS — Z888 Allergy status to other drugs, medicaments and biological substances status: Secondary | ICD-10-CM | POA: Diagnosis not present

## 2017-05-13 DIAGNOSIS — F3131 Bipolar disorder, current episode depressed, mild: Secondary | ICD-10-CM | POA: Diagnosis present

## 2017-05-13 DIAGNOSIS — F4323 Adjustment disorder with mixed anxiety and depressed mood: Secondary | ICD-10-CM | POA: Diagnosis present

## 2017-05-13 DIAGNOSIS — F909 Attention-deficit hyperactivity disorder, unspecified type: Secondary | ICD-10-CM | POA: Diagnosis present

## 2017-05-13 DIAGNOSIS — R45851 Suicidal ideations: Secondary | ICD-10-CM | POA: Diagnosis present

## 2017-05-13 DIAGNOSIS — Z9114 Patient's other noncompliance with medication regimen: Secondary | ICD-10-CM | POA: Diagnosis not present

## 2017-05-13 MED ORDER — OLANZAPINE 5 MG PO TBDP
15.0000 mg | ORAL_TABLET | Freq: Every day | ORAL | Status: DC
  Administered 2017-05-13 – 2017-05-17 (×5): 15 mg via ORAL
  Filled 2017-05-13 (×5): qty 3

## 2017-05-13 MED ORDER — ALUM & MAG HYDROXIDE-SIMETH 200-200-20 MG/5ML PO SUSP: 30.0000 mL | ORAL | Status: DC | PRN

## 2017-05-13 MED ORDER — HYDROXYZINE HCL 50 MG PO TABS
50.0000 mg | ORAL_TABLET | Freq: Three times a day (TID) | ORAL | Status: DC | PRN
  Filled 2017-05-13: qty 1

## 2017-05-13 MED ORDER — OLANZAPINE 5 MG PO TBDP: 5.0000 mg | ORAL_TABLET | Freq: Three times a day (TID) | ORAL | Status: DC | PRN

## 2017-05-13 MED ORDER — ACETAMINOPHEN 325 MG PO TABS: 650.0000 mg | ORAL_TABLET | Freq: Four times a day (QID) | ORAL | Status: DC | PRN

## 2017-05-13 MED ORDER — TRAZODONE HCL 100 MG PO TABS
100.0000 mg | ORAL_TABLET | Freq: Every evening | ORAL | Status: DC | PRN
  Filled 2017-05-13: qty 1

## 2017-05-13 MED ORDER — MAGNESIUM HYDROXIDE 400 MG/5ML PO SUSP: 30.0000 mL | Freq: Every day | ORAL | Status: DC | PRN
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
## 2017-05-13 NOTE — BH Assessment (Addendum)
Patient has been accepted to [facility].  Accepting physician is Dr. Jennet MaduroPucilowska.  Attending Physician will be Dr. Johnella MoloneyMcNew.  Patient has been assigned to room 323, by [facility] Charge Nurse FairmountGwen F.  Call report to [phone].  Representative/Transfer Coordinator is Warden/rangerCalvin Patient pre-admitted by [facility] Patient Access Ethelene Browns(Anthony)  [facility] Staff Forest Becker(Jean S., TTS/Soical Worker Disposition ) made aware of acceptance.

## 2017-05-13 NOTE — Tx Team (Signed)
Initial Treatment Plan 05/13/2017 10:49 PM Jonny RuizMarie Elizabeth [facility] ZOX:[id number]    PATIENT STRESSORS: Financial difficulties Health problems Medication change or noncompliance Occupational concerns   PATIENT STRENGTHS: Average or above average intelligence Capable of independent living Communication skills Motivation for treatment/growth Supportive family/friends   PATIENT IDENTIFIED PROBLEMS: Bizarre thouhts    Depression/Anxiety    None Compliances with medication             DISCHARGE CRITERIA:  Adequate post-discharge living arrangements Improved stabilization in mood, thinking, and/or behavior Medical problems require only outpatient monitoring Motivation to continue treatment in a less acute level of care Need for constant or close observation no longer present Reduction of life-threatening or endangering symptoms to within safe limits  PRELIMINARY DISCHARGE PLAN: Attend 12-step recovery group Outpatient therapy Participate in family therapy Placement in alternative living arrangements Return to previous living arrangement  PATIENT/FAMILY INVOLVEMENT: This treatment plan has been presented to and reviewed with the patient, Debbie King,  The patien have been given the opportunity to ask questions and make suggestions.  [name]  Yvonda Fouty, RN 05/13/2017, 10:49 PM

## 2017-05-14 DIAGNOSIS — F250 Schizoaffective disorder, bipolar type: Principal | ICD-10-CM

## 2017-05-14 LAB — COMPREHENSIVE METABOLIC PANEL
ALBUMIN: 4.3 g/dL (ref 3.5–5.0)
ALT: 11 U/L — ABNORMAL LOW (ref 14–54)
ANION GAP: 5 (ref 5–15)
AST: 17 U/L (ref 15–41)
Alkaline Phosphatase: 48 U/L (ref 38–126)
BUN: 14 mg/dL (ref 6–20)
CHLORIDE: 106 mmol/L (ref 101–111)
CO2: 29 mmol/L (ref 22–32)
Calcium: 9 mg/dL (ref 8.9–10.3)
Creatinine, Ser: 0.91 mg/dL (ref 0.44–1.00)
GFR calc Af Amer: >60 mL/min (ref >60)
GFR calc non Af Amer: >60 mL/min (ref >60)
GLUCOSE: 87 mg/dL (ref 65–99)
POTASSIUM: 4.1 mmol/L (ref 3.5–5.1)
SODIUM: 140 mmol/L (ref 135–145)
Total Bilirubin: 0.8 mg/dL (ref 0.3–1.2)
Total Protein: 6.8 g/dL (ref 6.5–8.1)

## 2017-05-14 LAB — CBC
HEMATOCRIT: 41 % (ref 35.0–47.0)
HEMOGLOBIN: 14.2 g/dL (ref 12.0–16.0)
MCH: 31.2 pg (ref 26.0–34.0)
MCHC: 34.5 g/dL (ref 32.0–36.0)
MCV: 90.4 fL (ref 80.0–100.0)
Platelets: 249 10*3/uL (ref 150–440)
RBC: 4.53 MIL/uL (ref 3.80–5.20)
RDW: 13 % (ref 11.5–14.5)
WBC: 6.1 10*3/uL (ref 3.6–11.0)

## 2017-05-14 MED ORDER — ARIPIPRAZOLE 5 MG PO TABS
5.0000 mg | ORAL_TABLET | Freq: Every day | ORAL | Status: DC
  Administered 2017-05-14 – 2017-05-15 (×2): 5 mg via ORAL
  Filled 2017-05-14 (×2): qty 1

## 2017-05-14 NOTE — [facility] Suicide Risk Assessment (Signed)
[facility] Ad[facility] Suicide [facility] Assessment   Nursing information obtained from:  Patient Demographic factors:  Unemployed Current Mental Status:  Self-harm behaviors Loss Factors:  Financial problems / change in socioeconomic status Historical Factors:  Impulsivity Risk Reduction Factors:  Positive therapeutic relationship  Total Time spent with patient: 45 minutes Principal Problem: Schizoaffective disorder, bipolar type (HCC) Diagnosis:   Patient Active Problem List   Diagnosis Date Noted  . Schizoaffective disorder, bipolar type (HCC) [F25.0] 03/31/2017    Priority: High  . Noncompliance [Z91.19] 03/30/2017  . Routine general medical examination at a health care facility [Z00.00] 11/11/2012   Subjective Data: See H&P  Continued Clinical Symptoms:  Alcohol Use Disorder Identification Test Final Score (AUDIT): 4 The "Alcohol Use Disorders Identification Test", Guidelines for Use in Primary Care, Second Edition.  World Science writerHealth Organization [facility](WHO). Score between 0-7:  no or low risk or alcohol related problems. Score between 8-15:  moderate risk of alcohol related problems. Score between 16-19:  high risk of alcohol related problems. Score 20 or above:  warrants further diagnostic evaluation for alcohol dependence and treatment.   CLINICAL FACTORS:   Schizophrenia:   Less than 24 years old Unstable or Poor Therapeutic Relationship Previous Psychiatric Diagnoses and Treatments    COGNITIVE FEATURES THAT CONTRIBUTE TO RISK:  Closed-mindedness and Thought constriction (tunnel vision)    SUICIDE RISK:   Mild:  Suicidal ideation of limited frequency, intensity, duration, and specificity.  There are no identifiable plans, no associated intent, mild dysphoria and related symptoms, good self-control (both objective and subjective assessment), few other risk factors, and identifiable protective factors, including available and accessible social support.  PLAN OF CARE: See H&P  I certify  that inpatient services furnished can reasonably be expected to improve the patient's condition.   Haskell RilingHolly R Mayan Dolney, MD 05/14/2017, 12:45 PM

## 2017-05-14 NOTE — [facility] Group Notes (Signed)
LCSW Group Therapy Note 05/14/2017 9:00 AM  Type of Therapy and Topic:  Group Therapy:  Setting Goals  Participation Level:  Did Not Attend  Description of Group: In this process group, patients discussed using strengths to work toward goals and address challenges.  Patients identified two positive things about themselves and one goal they were working on.  Patients were given the opportunity to share openly and support each other's plan for self-empowerment.  The group discussed the value of gratitude and were encouraged to have a daily reflection of positive characteristics or circumstances.  Patients were encouraged to identify a plan to utilize their strengths to work on current challenges and goals.  Therapeutic Goals 1. Patient will verbalize personal strengths/positive qualities and relate how these can assist with achieving desired personal goals 2. Patients will verbalize affirmation of peers plans for personal change and goal setting 3. Patients will explore the value of gratitude and positive focus as related to successful achievement of goals 4. Patients will verbalize a plan for regular reinforcement of personal positive qualities and circumstances.  Summary of Patient Progress: Hilda LiasMarie was invited to group, but chose not to attend.      Therapeutic Modalities Cognitive Behavioral Therapy Motivational Interviewing    Alease FrameSonya S Lucio Litsey, [place]CS[place] 05/14/2017 2:22 PM
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
## 2017-05-14 NOTE — Progress Notes (Signed)
Recreation Therapy Notes  INPATIENT RECREATION THERAPY ASSESSMENT  Patient Details Name: Garnet KoyanagiMarie Elizabeth Juday MRN: [id number] DOB: May 09, 1995 Today's Date: 05/14/2017       Information Obtained From: Patient  Able to Participate in Assessment/Interview: Yes  Patient Presentation: Responsive  Reason for Admission (Per Patient): Active Symptoms(Mom thought I was Hallucinating)  Patient Stressors: Family  Coping Skills:   Other (Comment)(Medication)  Leisure Interests (2+):  Ashby DawesNature - Other (Comment)(Going outside)  Frequency of Recreation/Participation: Weekly  Awareness of Community Resources:  Yes  Community Resources:     Current Use: No  If no, Barriers?:    Expressed Interest in [employer] CorporationCommunity Resource Information: No  [place]  Patient Main Form of Transportation: Set designerCar  Patient Strengths:  Believe in God, Cosmetology license  Patient Identified Areas of Improvement:  Stay on Medication  Patient Goal for Hospitalization:  Do not know  Current SI (including self-harm):  No  Current HI:  No  Current AVH: No  Staff Intervention Plan: Group Attendance, Collaborate with Interdisciplinary Treatment Team  Consent to Intern Participation: N/A  Gemma Ruan 05/14/2017, 1:41 PM

## 2017-05-14 NOTE — [facility] Group Notes (Signed)
05/14/2017  Time: 1PM  Type of Therapy/Topic:  Group Therapy:  Balance in Life  Participation Level:  Did Not Attend  Description of Group:   This group will address the concept of balance and how it feels and looks when one is unbalanced. Patients will be encouraged to process areas in their lives that are out of balance and identify reasons for remaining unbalanced. Facilitators will guide patients in utilizing problem-solving interventions to address and correct the stressor making their life unbalanced. Understanding and applying boundaries will be explored and addressed for obtaining and maintaining a balanced life. Patients will be encouraged to explore ways to assertively make their unbalanced needs known to significant others in their lives, using other group members and facilitator for support and feedback.  Therapeutic Goals: 1. Patient will identify two or more emotions or situations they have that consume much of in their lives. 2. Patient will identify signs/triggers that life has become out of balance:  3. Patient will identify two ways to set boundaries in order to achieve balance in their lives:  4. Patient will demonstrate ability to communicate their needs through discussion and/or role plays  Summary of Patient Progress: Pt continues to work towards their tx goals but has not yet reached them. Pt was able to appropriately participate in group discussion, and was able to offer support/validation to other group members.    Therapeutic Modalities:   Cognitive Behavioral Therapy Solution-Focused Therapy Assertiveness Training  Heidi DachKelsey Cela Newcom, MSW, LCSW Clinical Social Worker 05/14/2017 2:02 PM
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
## 2017-05-14 NOTE — Progress Notes (Signed)
Received Debbie King this am in her room asleep, she continued to sleep through breakfast. She woke up later with several requests and ate lunch. She denied all of the psychiatric symptoms and does not feel she is being poison at home. She verbalized feeling safe to return home and hoping she will not be here for 2 weeks. She was given literature on Abilify and it was attached to her AVS. She got very upset reading the literature and pretended she was going to faint, laid down on the floor. She eventually got up and went to her room. She stated the medication makes her feel weak.

## 2017-05-14 NOTE — Progress Notes (Signed)
Recreation Therapy Notes  Date: 05/14/2017  Time: 9:30 am   Location: Craft Room   Behavioral response: N/A   Intervention Topic: Self-care   Discussion/Intervention: Patient did not attend group.   Clinical Observations/Feedback:  Patient did not attend group.          Darrol Brandenburg 05/14/2017 11:40 AM 

## 2017-05-14 NOTE — Care Management Note (Signed)
In the course of conducting PTA medication history, this pharmacy technician reviewed patient's chart notes which led me to contact [facility] ([place]). I spoke with RN Loraine LericheMark who indicated that the patient had received a dose of INVEGA SUSTENNA 234MG  in early March with a second dose due 28 days later. He reported that the patient did not want that high a dose due to side-effects. Patient was offered and initially approved INVEGA SUSTENNA 156MG  dose but refused day before administration requesting oral medication instead. Was prescribed ZYPREXA 5MG  which pharmacy reports was picked up 05/07/2017.  RN Loraine LericheMark went on to report that during patient's brief history with clinic, she continuously demanded ADHD medication, specifically ADDERALL. [facility] would not prescribe due to patient's other mental health concerns. RN Loraine LericheMark reports that patient prefers oral medications while mother (who is not currently on HIPPA release) prefers injections. RN Loraine LericheMark also shares that, if cost is an issue, clinic had samples of LATUDA available if deemed appropriate.  Patient was prescribed ADDERALL 10MG  by family practitioner on 05/06/2017. Pharmacy confirms patient did pick up the medication later that day.   **The above is intended for informational purposes only and in no way should be considered a recommendation or endorsement by this writer of any particular course of therapy or treatment.**

## 2017-05-15 MED ORDER — ARIPIPRAZOLE 10 MG PO TABS
10.0000 mg | ORAL_TABLET | Freq: Every day | ORAL | Status: DC
  Administered 2017-05-16 – 2017-05-18 (×3): 10 mg via ORAL
  Filled 2017-05-15 (×4): qty 1

## 2017-05-15 NOTE — Progress Notes (Signed)
Recreation Therapy Notes   Date: 05/15/2017  Time: 9:30 am   Location: Craft Room   Behavioral response: N/A   Intervention Topic: Stress   Discussion/Intervention: Patient did not attend group.   Clinical Observations/Feedback:  Patient did not attend group.   Caelan Atchley LRT/CTRS        Odean Mcelwain 05/15/2017 12:23 PM 

## 2017-05-15 NOTE — Progress Notes (Signed)
Patient alert and oriented x 4, denies  SI/HI/AVH no distress noted, thoughts are organized, affect is brighter , 15 minutes checks maintained will continue to monitor.

## 2017-05-15 NOTE — BHH Group Notes (Signed)
05/15/2017 1PM  Type of Therapy and Topic:  Group Therapy:  Feelings around Relapse and Recovery  Participation Level:  Did Not Attend   Description of Group:    Patients in this group will discuss emotions they experience before and after a relapse. They will process how experiencing these feelings, or avoidance of experiencing them, relates to having a relapse. Facilitator will guide patients to explore emotions they have related to recovery. Patients will be encouraged to process which emotions are more powerful. They will be guided to discuss the emotional reaction significant others in their lives may have to patients' relapse or recovery. Patients will be assisted in exploring ways to respond to the emotions of others without this contributing to a relapse.  Therapeutic Goals: 1. Patient will identify two or more emotions that lead to a relapse for them 2. Patient will identify two emotions that result when they relapse 3. Patient will identify two emotions related to recovery 4. Patient will demonstrate ability to communicate their needs through discussion and/or role plays   Summary of Patient Progress: Patient was encouraged and invited to attend group. Patient did not attend group. Social worker will continue to encourage group participation in the future.      Therapeutic Modalities:   Cognitive Behavioral Therapy Solution-Focused Therapy Assertiveness Training Relapse Prevention Therapy   Johny ShearsCassandra  Melanye Hiraldo, Alexander MtLCSW 05/15/2017 2:39 PM

## 2017-05-15 NOTE — Tx Team (Addendum)
Interdisciplinary Treatment and Diagnostic Plan Update  05/15/2017 Time of Session: [address] Elizabeth King MRN: [id number]  Principal Diagnosis: Schizoaffective disorder, bipolar type [facility](HCC)  Secondary Diagnoses: Principal Problem:   Schizoaffective disorder, bipolar type (HCC)   Current Medications:  Current Facility-Administered Medications  Medication Dose Route Frequency Provider Last Rate Last Dose  . acetaminophen (TYLENOL) tablet 650 mg  650 mg Oral Q6H PRN Pucilowska, Jolanta B, MD      . alum & mag hydroxide-simeth (MAALOX/MYLANTA) 200-200-20 MG/5ML suspension 30 mL  30 mL Oral Q4H PRN Pucilowska, Jolanta B, MD      . ARIPiprazole (ABILIFY) tablet 5 mg  5 mg Oral Daily McNew, Ileene HutchinsonHolly R, MD   5 mg at 05/15/17 0902  . hydrOXYzine (ATARAX/VISTARIL) tablet 50 mg  50 mg Oral TID PRN Pucilowska, Jolanta B, MD      . magnesium hydroxide (MILK OF MAGNESIA) suspension 30 mL  30 mL Oral Daily PRN Pucilowska, Jolanta B, MD      . OLANZapine zydis (ZYPREXA) disintegrating tablet 15 mg  15 mg Oral QHS Pucilowska, Jolanta B, MD   15 mg at 05/14/17 2104  . OLANZapine zydis (ZYPREXA) disintegrating tablet 5 mg  5 mg Oral TID PRN Pucilowska, Jolanta B, MD      . traZODone (DESYREL) tablet 100 mg  100 mg Oral QHS PRN Pucilowska, Jolanta B, MD       PTA Medications: Medications Prior to Admission  Medication Sig Dispense Refill Last Dose  . amphetamine-dextroamphetamine (ADDERALL) 10 MG tablet Take 10 mg by mouth 2 (two) times daily.   Unknown at Unknown  . hydrOXYzine (VISTARIL) 25 MG capsule Take 25 mg by mouth 3 (three) times daily as needed (anxiety, stiffness and restlessness).   PRN at PRN  . OLANZapine (ZYPREXA) 5 MG tablet Take 5 mg by mouth at bedtime.  1 Unknown at Unknown  . paliperidone (INVEGA SUSTENNA) 234 MG/1.5ML SUSP injection Inject 234 mg into the muscle once for 1 dose. Next injection due 05/08/2017 1.5 mL 0 UTD at UTD    Patient Stressors: Financial difficulties Health  problems Medication change or noncompliance Occupational concerns  Patient Strengths: Average or above average intelligence Capable of independent living Communication skills Motivation for treatment/growth Supportive family/friends  Treatment Modalities: Medication Management, Group therapy, Case management,  1 to 1 session with clinician, Psychoeducation, Recreational therapy.   Physician Treatment Plan for Primary Diagnosis: Schizoaffective disorder, bipolar type (HCC) Long Term Goal(s): Improvement in symptoms so as ready for discharge   Short Term Goals: Ability to disclose and discuss suicidal ideas Ability to demonstrate self-control will improve Compliance with prescribed medications will improve  Medication Management: Evaluate patient's response, side effects, and tolerance of medication regimen.  Therapeutic Interventions: 1 to 1 sessions, Unit Group sessions and Medication administration.  Evaluation of Outcomes: Progressing  Physician Treatment Plan for Secondary Diagnosis: Principal Problem:   Schizoaffective disorder, bipolar type (HCC)  Long Term Goal(s): Improvement in symptoms so as ready for discharge   Short Term Goals: Ability to disclose and discuss suicidal ideas Ability to demonstrate self-control will improve Compliance with prescribed medications will improve     Medication Management: Evaluate patient's response, side effects, and tolerance of medication regimen.  Therapeutic Interventions: 1 to 1 sessions, Unit Group sessions and Medication administration.  Evaluation of Outcomes: Progressing   RN Treatment Plan for Primary Diagnosis: Schizoaffective disorder, bipolar type (HCC) Long Term Goal(s): Knowledge of disease and therapeutic regimen to maintain health will improve  Short Term Goals:  Ability to verbalize feelings will improve, Ability to identify and develop effective coping behaviors will improve and Compliance with prescribed  medications will improve  Medication Management: RN will administer medications as ordered by provider, will assess and evaluate patient's response and provide education to patient for prescribed medication. RN will report any adverse and/or side effects to prescribing provider.  Therapeutic Interventions: 1 on 1 counseling sessions, Psychoeducation, Medication administration, Evaluate responses to treatment, Monitor vital signs and CBGs as ordered, Perform/monitor CIWA, COWS, AIMS and Fall Risk screenings as ordered, Perform wound care treatments as ordered.  Evaluation of Outcomes: Progressing   LCSW Treatment Plan for Primary Diagnosis: Schizoaffective disorder, bipolar type (HCC) Long Term Goal(s): Safe transition to appropriate next level of care at discharge, Engage patient in therapeutic group addressing interpersonal concerns.  Short Term Goals: Engage patient in aftercare planning with referrals and resources, Facilitate acceptance of mental health diagnosis and concerns and Increase skills for wellness and recovery  Therapeutic Interventions: Assess for all discharge needs, 1 to 1 time with Social worker, Explore available resources and support systems, Assess for adequacy in community support network, Educate family and significant other(s) on suicide prevention, Complete Psychosocial Assessment, Interpersonal group therapy.  Evaluation of Outcomes: Progressing   Progress in Treatment: Attending groups: Yes. Participating in groups: Yes. Taking medication as prescribed: Yes. Toleration medication: Yes. Family/Significant other contact made: Yes, individual(s) contacted:  pt's father Patient understands diagnosis: Yes. Discussing patient identified problems/goals with staff: Yes. Medical problems stabilized or resolved: Yes. Denies suicidal/homicidal ideation: Yes. Issues/concerns per patient self-inventory: No. Other: None at this time.   New problem(s) identified: No,  Describe:  none at this time.  New Short Term/Long Term Goal(s): Pt reported her goal for treatment is to, "get a job."   Discharge Plan or Barriers: Pt will be discharged to her father's home and will continue treatment in the outpatient setting with [facility].   Reason for Continuation of Hospitalization: Hallucinations Medication stabilization  Estimated Length of Stay: 3 days   Recreational Therapy: Patient Stressors: Family Patient Goal: Patient will engage in groups without prompting or encouragement from LRT x3 group sessions within 5 recreation therapy group sessions  Attendees: Patient: Debbie King  05/15/2017 11:23 AM  Physician: Dr. Johnella Moloney, MD 05/15/2017 11:23 AM  Nursing: Ace Gins, RN 05/15/2017 11:23 AM  RN Care Manager: 05/15/2017 11:23 AM  Social Worker: [name], LCSW 05/15/2017 11:23 AM  Recreational Therapist:  Garret Reddish, CTRS-LRT 05/15/2017 11:23 AM  Other: Johny Shears, LCSWA 05/15/2017 11:23 AM  Other: Matilde Bash, LCSWA 05/15/2017 11:23 AM  Other: 05/15/2017 11:23 AM    Scribe for Treatment Team: [name], LCSW 05/15/2017 11:27 AM

## 2017-05-15 NOTE — Plan of Care (Addendum)
Isolates. Denies SI/HI/AVH.   Minimal communication. Support and encouragement offered.  Safety rounds maintained.   Problem: Activity: Goal: Interest or engagement in leisure activities will improve Outcome: Progressing Goal: Imbalance in normal sleep/wake cycle will improve Outcome: Progressing   Problem: Coping: Goal: Coping ability will improve Outcome: Progressing Goal: Will verbalize feelings Outcome: Progressing   Problem: Health Behavior/Discharge Planning: Goal: Compliance with therapeutic regimen will improve Outcome: Progressing   Problem: Safety: Goal: Ability to disclose and discuss suicidal ideas will improve Outcome: Progressing   Problem: Coping: Goal: Ability to interact with others will improve Outcome: Progressing Goal: Ability to use eye contact when communicating with others will improve Outcome: Progressing   Problem: Education: Goal: Will be free of psychotic symptoms Outcome: Progressing Goal: Knowledge of the prescribed therapeutic regimen will improve Outcome: Progressing   Problem: Self-Concept: Goal: Will verbalize positive feelings about self Outcome: Progressing   Problem: Activity: Goal: Risk for activity intolerance will decrease Outcome: Progressing   Problem: Nutrition: Goal: Adequate nutrition will be maintained Outcome: Progressing   Problem: Safety: Goal: Ability to remain free from injury will improve Outcome: Progressing

## 2017-05-15 NOTE — Plan of Care (Signed)
  Problem: Activity: ?Goal: Interest or engagement in leisure activities will improve ?Outcome: Progressing ?  ?Problem: Activity: ?Goal: Imbalance in normal sleep/wake cycle will improve ?Outcome: Progressing ?  ?Problem: Coping: ?Goal: Coping ability will improve ?Outcome: Progressing ?  ?

## 2017-05-15 NOTE — Progress Notes (Signed)
[facility] MD Progress Note  05/15/2017 1:09 PM Debbie Parrales  MRN:  [id number] Subjective:  Pt has been calm on the unit. She is much less paranoid and disorganized compared to previous visit. Per RN staff, no issues overnight. She is organized in conversation today. She does mention that she did feel her ex-boyfriend was sending her death threats through [employer]. However, she did not perseverate on this like she has in the past. She states that she deleted the app so he cant contact her. She has not had any other contact from him. She is adament that she has been taking Zyprexa daily and feels it is helpful. She states that she slept well last night. She had a visit from her father and it went well. She plans to continue to stay with him. She does not want [place] having any contact with her mother. She denies SI, HI, AH, VH. She is less irritable today.   Principal Problem: Schizoaffective disorder, bipolar type ([place]) Diagnosis:   Patient Active Problem List   Diagnosis Date Noted  . Schizoaffective disorder, bipolar type ([place]) [F25.0] 03/31/2017    Priority: High  . Noncompliance [Z91.19] 03/30/2017  . Routine general medical examination at a health care facility [Z00.00] 11/11/2012   Total Time spent with patient: 20 minutes  Past Psychiatric History: See H&P  Past Medical History:  Past Medical History:  Diagnosis Date  . ADD (attention deficit disorder) 12/13/2013  . Adjustment disorder with mixed anxiety and depressed mood 12/13/2013  . Anxiety   . Bipolar 1 disorder, depressed, mild ([place]) 12/13/2013  . Bipolar disorder ([place])   . Current non-smoker but past smoking history unknown   . Depression   . Depression with suicidal ideation 10/08/2013  . Major depressive disorder, recurrent episode, severe, without mention of psychotic behavior 10/14/2013  . Major depressive disorder, single episode, severe, without mention of psychotic behavior 10/14/2013  . Suicidal ideations 10/14/2013     Past Surgical History:  Procedure Laterality Date  . TONSILLECTOMY    . WISDOM TOOTH EXTRACTION     Family History: History reviewed. No pertinent family history. Family Psychiatric  History: See H&P Social History:  Social History   Substance and Sexual Activity  Alcohol Use No  . Alcohol/week: 0.0 oz     Social History   Substance and Sexual Activity  Drug Use No  . Types: Cocaine, Marijuana   Comment: Former Systems developer    Social History   Socioeconomic History  . Marital status: Single    Spouse name: Not on file  . Number of children: Not on file  . Years of education: Not on file  . Highest education level: Not on file  Occupational History  . Not on file  Social Needs  . Financial resource strain: Not on file  . Food insecurity:    Worry: Not on file    Inability: Not on file  . Transportation needs:    Medical: Not on file    Non-medical: Not on file  Tobacco Use  . Smoking status: Former Research scientist (life sciences)  . Smokeless tobacco: Never Used  Substance and Sexual Activity  . Alcohol use: No    Alcohol/week: 0.0 oz  . Drug use: No    Types: Cocaine, Marijuana    Comment: Former Systems developer  . Sexual activity: Never  Lifestyle  . Physical activity:    Days per week: Not on file    Minutes per session: Not on file  . Stress: Not on file  Relationships  . Social connections:    Talks on phone: Not on file    Gets together: Not on file    Attends religious service: Not on file    Active member of club or organization: Not on file    Attends meetings of clubs or organizations: Not on file    Relationship status: Not on file  Other Topics Concern  . Not on file  Social History Narrative   ** Merged History Encounter **       Additional Social History:                         Sleep: Good  Appetite:  Fair  Current Medications: Current Facility-Administered Medications  Medication Dose Route Frequency Provider Last Rate Last Dose  . acetaminophen (TYLENOL)  tablet 650 mg  650 mg Oral Q6H PRN Pucilowska, Jolanta B, MD      . alum & mag hydroxide-simeth (MAALOX/MYLANTA) 200-200-20 MG/5ML suspension 30 mL  30 mL Oral Q4H PRN Pucilowska, Jolanta B, MD      . ARIPiprazole (ABILIFY) tablet 5 mg  5 mg Oral Daily Milissa Fesperman, Tyson Babinski, MD   5 mg at 05/15/17 0902  . hydrOXYzine (ATARAX/VISTARIL) tablet 50 mg  50 mg Oral TID PRN Pucilowska, Jolanta B, MD      . magnesium hydroxide (MILK OF MAGNESIA) suspension 30 mL  30 mL Oral Daily PRN Pucilowska, Jolanta B, MD      . OLANZapine zydis (ZYPREXA) disintegrating tablet 15 mg  15 mg Oral QHS Pucilowska, Jolanta B, MD   15 mg at 05/14/17 2104  . OLANZapine zydis (ZYPREXA) disintegrating tablet 5 mg  5 mg Oral TID PRN Pucilowska, Jolanta B, MD      . traZODone (DESYREL) tablet 100 mg  100 mg Oral QHS PRN Pucilowska, Jolanta B, MD        Lab Results:  Results for orders placed or performed during the hospital encounter of 05/13/17 (from the past 48 hour(s))  Comprehensive metabolic panel     Status: Abnormal   Collection Time: 05/14/17  1:33 PM  Result Value Ref Range   Sodium 140 135 - 145 mmol/L   Potassium 4.1 3.5 - 5.1 mmol/L   Chloride 106 101 - 111 mmol/L   CO2 29 22 - 32 mmol/L   Glucose, Bld 87 65 - 99 mg/dL   BUN 14 6 - 20 mg/dL   Creatinine, Ser 0.91 0.44 - 1.00 mg/dL   Calcium 9.0 8.9 - 10.3 mg/dL   Total Protein 6.8 6.5 - 8.1 g/dL   Albumin 4.3 3.5 - 5.0 g/dL   AST 17 15 - 41 U/L   ALT 11 (L) 14 - 54 U/L   Alkaline Phosphatase 48 38 - 126 U/L   Total Bilirubin 0.8 0.3 - 1.2 mg/dL   GFR calc non Af Amer >60 >60 mL/min   GFR calc Af Amer >60 >60 mL/min    Comment: (NOTE) The eGFR has been calculated using the CKD EPI equation. This calculation has not been validated in all clinical situations. eGFR's persistently <60 mL/min signify possible Chronic Kidney Disease.    Anion gap 5 5 - 15    Comment: Performed at [facility], [address] [place], [place] [address]  CBC     Status:  None   Collection Time: 05/14/17  1:33 PM  Result Value Ref Range   WBC 6.1 3.6 - 11.0 K/uL   RBC 4.53 3.80 -  5.20 MIL/uL   Hemoglobin 14.2 12.0 - 16.0 g/dL   HCT 41.0 35.0 - 47.0 %   MCV 90.4 80.0 - 100.0 fL   MCH 31.2 26.0 - 34.0 pg   MCHC 34.5 32.0 - 36.0 g/dL   RDW 13.0 11.5 - 14.5 %   Platelets 249 150 - 440 K/uL    Comment: Performed at [facility], [address] [place], [place] [address]    Blood Alcohol level:  Lab Results  Component Value Date   [facility] <10 04/25/2017   ETH <10 60/45/4098    Metabolic Disorder Labs: Lab Results  Component Value Date   HGBA1C 5.3 10/25/2013   MPG 105 10/25/2013   No results found for: PROLACTIN Lab Results  Component Value Date   CHOL 140 10/25/2013   TRIG 140 10/25/2013   HDL 44 10/25/2013   CHOLHDL 3.2 10/25/2013   VLDL 28 10/25/2013   LDLCALC 68 10/25/2013    Physical Findings: AIMS: Facial and Oral Movements Muscles of Facial Expression: None, normal Lips and Perioral Area: None, normal Jaw: None, normal Tongue: None, normal,Extremity Movements Upper (arms, wrists, hands, fingers): None, normal Lower (legs, knees, ankles, toes): None, normal, Trunk Movements Neck, shoulders, hips: None, normal, Overall Severity Severity of abnormal movements (highest score from questions above): None, normal Incapacitation due to abnormal movements: None, normal Patient's awareness of abnormal movements (rate only patient's report): No Awareness, Dental Status Current problems with teeth and/or dentures?: No Does patient usually wear dentures?: No  CIWA:  CIWA-Ar Total: 2 COWS:  COWS Total Score: 2  Musculoskeletal: Strength & Muscle Tone: within normal limits Gait & Station: normal Patient leans: N/A  Psychiatric Specialty Exam: Physical Exam  Nursing note and vitals reviewed.   Review of Systems  All other systems reviewed and are negative.   Blood pressure (!) 88/67, pulse (!) 111, temperature 97.9 F (36.6  C), temperature source Oral, resp. rate 18, height '5\' 3"'  (1.6 m), weight 57.2 kg (126 lb), SpO2 99 %.Body mass index is 22.32 kg/m.  General Appearance: Disheveled  Eye Contact:  Good  Speech:  Clear and Coherent  Volume:  Normal  Mood:  Euthymic  Affect:  Appropriate  Thought Process:  Coherent and Goal Directed  Orientation:  Full (Time, Place, and Person)  Thought Content:  Delusions but overall organized  Suicidal Thoughts:  No  Homicidal Thoughts:  No  Memory:  Immediate;   Fair  Judgement:  Impaired  Insight:  Lacking  Psychomotor Activity:  Normal  Concentration:  Concentration: Fair  Recall:  [employer] of Knowledge:  Fair  Language:  Fair  Akathisia:  No      Assets:  Resilience  ADL's:  Intact  Cognition:  WNL  Sleep:  Number of Hours: 7.5     Treatment Plan Summary: 22 yo female admitted due to paranoia and psychosis. She has overall been very calm on the unit. She is much less paranoid than previous admission. She does have a delusion that her ex-boyfriend is sending her messages through Pandora but she is much less perseverative on this than previous. She has been medication compliant and does agree to continue medications when she discharges. She was started on Abilify in addition to Zyprexa in case she would need LAI in the future. She does have history of noncompliance but recently has been compliant. No indication for forced medications at this time. She denies SI. No unsafe behaviors on the unit.   Plan:  Schizoaffective disorder -  Increase Abilify to 10 mg daily with plan to possibly pursue LAI if needed in the future -Continue Zyprexa 15 mg qhs  Dispo -She will return to live with her father on discharge   Marylin Crosby, MD 05/15/2017, 1:09 PM

## 2017-05-16 DIAGNOSIS — R000 Tachycardia, unspecified: Secondary | ICD-10-CM

## 2017-05-16 DIAGNOSIS — R031 Nonspecific low blood-pressure reading: Secondary | ICD-10-CM

## 2017-05-16 NOTE — Plan of Care (Signed)
  Problem: Coping: Goal: Coping ability will improve Outcome: Progressing   Problem: Health Behavior/Discharge Planning: Goal: Compliance with therapeutic regimen will improve Outcome: Progressing Note:  Medication compliant. No group attendance   Problem: Safety: Goal: Ability to disclose and discuss suicidal ideas will improve Outcome: Progressing Note:  Denies SI   Problem: Coping: Goal: Ability to use eye contact when communicating with others will improve Outcome: Progressing Note:  Eye contact fair   Problem: Education: Goal: Will be free of psychotic symptoms Outcome: Progressing Goal: Knowledge of the prescribed therapeutic regimen will improve Outcome: Progressing   Problem: Nutrition: Goal: Adequate nutrition will be maintained Outcome: Progressing   Problem: Coping: Goal: Level of anxiety will decrease Outcome: Progressing   Problem: Activity: Goal: Interest or engagement in leisure activities will improve Outcome: Not Progressing Note:  No group attendance.  Isolates to self. No interaction noted with peers when out in the milieu.  Up for meals.     Problem: Health Behavior/Discharge Planning: Goal: Ability to make decisions will improve Outcome: Not Progressing   Problem: Coping: Goal: Ability to interact with others will improve Outcome: Not Progressing Note:  No interaction noted with peers

## 2017-05-16 NOTE — Plan of Care (Signed)
Cooperative in the milieu. Able to express needs and feelings

## 2017-05-16 NOTE — [facility] Group Notes (Signed)
LCSW Group Therapy Note 05/16/2017 1:15pm  Type of Therapy and Topic: Group Therapy: Feelings Around Returning Home & Establishing a Supportive Framework and Supporting Oneself When Supports Not Available  Participation Level: Active  Description of Group:  Patients first processed thoughts and feelings about upcoming discharge. These included fears of upcoming changes, lack of change, new living environments, judgements and expectations from others and overall stigma of mental health issues. The group then discussed the definition of a supportive framework, what that looks and feels like, and how do to discern it from an unhealthy non-supportive network. The group identified different types of supports as well as what to do when your family/friends are less than helpful or unavailable  Therapeutic Goals  1. Patient will identify one healthy supportive network that they can use at discharge. 2. Patient will identify one factor of a supportive framework and how to tell it from an unhealthy network. 3. Patient able to identify one coping skill to use when they do not have positive supports from others. 4. Patient will demonstrate ability to communicate their needs through discussion and/or role plays.  Summary of Patient Progress:  Pt engaged during group session. As patients processed their anxiety about discharge and described healthy supports patient shared that she is not suicidal that she is only here because her mom brought her to the hospital. She reported she is ready to be discharge to get a job.  Patients identified at least one self-care tool they were willing to use after discharge.   Therapeutic Modalities Cognitive Behavioral Therapy Motivational Interviewing   Debbie King  CUEBAS-COLON, LCSW 05/16/2017 4:09 PM

## 2017-05-16 NOTE — [facility] Group Notes (Signed)
[facility] Group Notes:  (Nursing/MHT/Case Management/Adjunct)  Date:  05/16/2017  Time:  10:18 PM  Type of Therapy:  Group Therapy  Participation Level:  Active  Participation Quality:  Appropriate  Affect:  Appropriate  Cognitive:  Alert  Insight:  Good  Engagement in Group:  Engaged  Modes of Intervention:  Activity  Summary of Progress/Problems:  Debbie King 05/16/2017, 10:18 PM

## 2017-05-16 NOTE — Progress Notes (Signed)
Patient stayed in the milieu until bedtime. Alert and oriented x4. Denying suicidal/homicidal thoughts. Denying hallucinations. Presented to the medication room and discussed about her medication regime. Received medications on the schedule and had a snack. Interacted wth staff and peers appropriately. Had no major concern. Currently in bed resting and staff continue to monitor.

## 2017-05-16 NOTE — Progress Notes (Signed)
[facility]  05/16/2017 4:34 PM Debbie KoyanagiMarie Elizabeth King  MRN:  [id number] Subjective:   I wanna go home today" Pt reportedly isolates herself in her room ,not social, BP low but asymptomatic. EKG pending. Pt reports she is ok and wanting to go home today. Denies SI/HI. Denies AVH. Taking meds denies side effects.  Principal Problem: Schizoaffective disorder, bipolar type ([facility]) Diagnosis:   Patient Active Problem List   Diagnosis Date Noted  . Schizoaffective disorder, bipolar type ([facility]) [F25.0] 03/31/2017  . Noncompliance [Z91.19] 03/30/2017  . Routine general medical examination at a health care facility [Z00.00] 11/11/2012   Total Time spent with patient: 30 minutes  Past Psychiatric History: no new info  Past Medical History:  Past Medical History:  Diagnosis Date  . ADD (attention deficit disorder) 12/13/2013  . Adjustment disorder with mixed anxiety and depressed mood 12/13/2013  . Anxiety   . Bipolar 1 disorder, depressed, mild ([facility]) 12/13/2013  . Bipolar disorder ([facility])   . Current non-smoker but past smoking history unknown   . Depression   . Depression with suicidal ideation 10/08/2013  . Major depressive disorder, recurrent episode, severe, without mention of psychotic behavior 10/14/2013  . Major depressive disorder, single episode, severe, without mention of psychotic behavior 10/14/2013  . Suicidal ideations 10/14/2013    Past Surgical History:  Procedure Laterality Date  . TONSILLECTOMY    . WISDOM TOOTH EXTRACTION     Family History: History reviewed. No pertinent family history. Family Psychiatric  History: no new info Social History:  Social History   Substance and Sexual Activity  Alcohol Use No  . Alcohol/week: 0.0 oz     Social History   Substance and Sexual Activity  Drug Use No  . Types: Cocaine, Marijuana   Comment: Former NeurosurgeonUser    Social History   Socioeconomic History  . Marital status: Single    Spouse name: Not on file  . Number  of children: Not on file  . Years of education: Not on file  . Highest education level: Not on file  Occupational History  . Not on file  Social Needs  . Financial resource strain: Not on file  . Food insecurity:    Worry: Not on file    Inability: Not on file  . Transportation needs:    Medical: Not on file    Non-medical: Not on file  Tobacco Use  . Smoking status: Former Games developermoker  . Smokeless tobacco: Never Used  Substance and Sexual Activity  . Alcohol use: No    Alcohol/week: 0.0 oz  . Drug use: No    Types: Cocaine, Marijuana    Comment: Former NeurosurgeonUser  . Sexual activity: Never  Lifestyle  . Physical activity:    Days per week: Not on file    Minutes per session: Not on file  . Stress: Not on file  Relationships  . Social connections:    Talks on phone: Not on file    Gets together: Not on file    Attends religious service: Not on file    Active member of club or organization: Not on file    Attends meetings of clubs or organizations: Not on file    Relationship status: Not on file  Other Topics Concern  . Not on file  Social History Narrative   ** Merged History Encounter **       Additional Social History:  Sleep: Good  Appetite:  Fair  Current Medications: Current Facility-Administered Medications  Medication Dose Route Frequency Provider Last Rate Last Dose  . acetaminophen (TYLENOL) tablet 650 mg  650 mg Oral Q6H PRN Pucilowska, Jolanta B, MD      . alum & mag hydroxide-simeth (MAALOX/MYLANTA) 200-200-20 MG/5ML suspension 30 mL  30 mL Oral Q4H PRN Pucilowska, Jolanta B, MD      . ARIPiprazole (ABILIFY) tablet 10 mg  10 mg Oral Daily McNew, Ileene Hutchinson, MD   10 mg at 05/16/17 0850  . hydrOXYzine (ATARAX/VISTARIL) tablet 50 mg  50 mg Oral TID PRN Pucilowska, Jolanta B, MD      . magnesium hydroxide (MILK OF MAGNESIA) suspension 30 mL  30 mL Oral Daily PRN Pucilowska, Jolanta B, MD      . OLANZapine zydis (ZYPREXA)  disintegrating tablet 15 mg  15 mg Oral QHS Pucilowska, Jolanta B, MD   15 mg at 05/15/17 2106  . OLANZapine zydis (ZYPREXA) disintegrating tablet 5 mg  5 mg Oral TID PRN Pucilowska, Jolanta B, MD      . traZODone (DESYREL) tablet 100 mg  100 mg Oral QHS PRN Pucilowska, Jolanta B, MD        Lab Results: No results found for this or any previous visit (from the past 48 hour(s)).  Blood Alcohol level:  Lab Results  Component Value Date   ETH <10 04/25/2017   ETH <10 03/29/2017    Metabolic Disorder Labs: Lab Results  Component Value Date   HGBA1C 5.3 10/25/2013   MPG 105 10/25/2013   No results found for: PROLACTIN Lab Results  Component Value Date   CHOL 140 10/25/2013   TRIG 140 10/25/2013   HDL 44 10/25/2013   CHOLHDL 3.2 10/25/2013   VLDL 28 10/25/2013   LDLCALC 68 10/25/2013    Physical Findings: AIMS: Facial and Oral Movements Muscles of Facial Expression: None, normal Lips and Perioral Area: None, normal Jaw: None, normal Tongue: None, normal,Extremity Movements Upper (arms, wrists, hands, fingers): None, normal Lower (legs, knees, ankles, toes): None, normal, Trunk Movements Neck, shoulders, hips: None, normal, Overall Severity Severity of abnormal movements (highest score from questions above): None, normal Incapacitation due to abnormal movements: None, normal Patient's awareness of abnormal movements (rate only patient's report): No Awareness, Dental Status Current problems with teeth and/or dentures?: No Does patient usually wear dentures?: No  CIWA:  CIWA-Ar Total: 2 COWS:  COWS Total Score: 2  Musculoskeletal: Strength & Muscle Tone: within normal limits Gait & Station: normal Patient leans:   Psychiatric Specialty Exam: Physical Exam  Nursing note and vitals reviewed.   ROS  Blood pressure (!) 96/56, pulse 70, temperature 97.9 F (36.6 C), temperature source Oral, resp. rate 16, height 5\' 3"  (1.6 m), weight 57.2 kg (126 lb), SpO2 98 %.Body mass  index is 22.32 kg/m.  General Appearance: Disheveled, age appropriate  Eye Contact:  fair  Speech:  Clear   Volume:  Normal  Mood:  Euthymic  Affect:  Appropriate  Thought Process:   Goal Directed  Orientation:  Full (Time, Place, and Person)  Thought Content:  wanting to go home, paranoid to exboyfriend  Suicidal Thoughts:  No  Homicidal Thoughts:  No  Memory:  intact  Judgement:  Impaired  Insight:  Lacking  Psychomotor Activity:  Normal  Concentration:  Concentration: Fair  Recall:  good  Fund of Knowledge:  Fair  Language:  Fair  Akathisia:  No      Assets:  Resilience  ADL's:  Intact  Cognition:  WNL  Sleep:  Number of Hours: 8        Treatment Plan Summary: Daily contact with patient to assess and evaluate symptoms and progress in treatment and Medication management.  Pt  paranoid to exboyfriend, but not aggressive. BP low, pt tachycardic , pt asymptomatic. EKG pending.  Cont current psych meds.  Monitor BP.  FU EKG.  Beverly Sessions, MD 05/16/2017, 4:34 PM

## 2017-05-17 NOTE — Plan of Care (Signed)
Denies SI/HI/AVH.  Denies any depression.  Personal hygiene has improved.  More visible in the milieu although stays to herself.  Participated in outside time. Support and encouragement offered.  Remains safe on the unit.

## 2017-05-17 NOTE — Plan of Care (Signed)
No major concern. Taking medications and attending activities

## 2017-05-17 NOTE — [facility] Group Notes (Signed)
LCSW Group Therapy Note 05/17/2017 1:00pm Type of Therapy and Topic:  Group Therapy:  Communication Participation Level:  Active  Description of Group: Patients will identify how individuals communicate with one another appropriately and inappropriately.  Patients will be guided to discuss their thoughts, feelings and behaviors related to barriers when communicating.  The group will process together ways to execute positive and appropriate communication with attention given to how one uses behavior, tone and body language.  Patients will be encouraged to reflect on a situation where they were successfully able to communicate and what made this example successful.  Group will identify specific changes they are motivated to make in order to overcome communication barriers with self, peers, authority, and parents.  This group will be process-oriented with patients participating in exploration of their own experiences, giving and receiving support, and challenging self and other group members.   Therapeutic Goals 1. Patient will express an understanding of the four types of verbal communication (passive, aggressive, passive aggressive, and assertive).  2. Patient will identify feelings (such as fear or worry), thought process and behaviors related to why people internalize feelings rather than express self openly. 3. Patient will identify their main communication style, and report how they plan to change their style to assertive. 4. Members will then practice through role play how to communicate using I statements, I feel statements, and acknowledging feelings rather than displacing feelings on others Summary of Patient Progress: Pt continues to work towards their tx goals but has not yet reached them. Pt was able to appropriately participate in group discussion, and was able to offer support/validation to other group members. Pt reported feeling, "alright today. I'm hoping I get to go home tomorrow." Pt  reported she feels she can be aggressive when communicating sometimes because, "I have a mood disorder." Pt reports she plans to, "take a deep breath," before speaking in order to work towards assertive communication.   Therapeutic Modalities Cognitive Behavioral Therapy Motivational Interviewing Solution Focused Therapy  Heidi DachKelsey Shevy Yaney, MSW, LCSW Clinical Social Worker 05/17/2017 2:00 PM

## 2017-05-17 NOTE — Progress Notes (Signed)
Pt currently sleeping. Stayed in the milieu until bed time. Was alert and oriented and denying thoughts of self harm. Participated in group and remained cooperative. Presented to the medication room and discussed her medication regime: verbalized understanding and said that medications are helping. Had no major concern. Staff continue to monitor for safety.

## 2017-05-17 NOTE — Progress Notes (Signed)
[facility]  05/17/2017 5:23 PM Debbie KoyanagiMarie Elizabeth King  MRN:  [id number] Subjective:   I 'm fine , I wanna go home " Pt continue to be isolative in her room ,  BP low but asymptomatic.  EKG- NSR, QTc-456. Pt anxious ,  wanting to go home .  Denies SI/HI. Denies AVH. Taking meds denies side effects.  Principal Problem: Schizoaffective disorder, bipolar type (HCC) Diagnosis:   Patient Active Problem List   Diagnosis Date Noted  . Schizoaffective disorder, bipolar type (HCC) [F25.0] 03/31/2017  . Noncompliance [Z91.19] 03/30/2017  . Routine general medical examination at a health care facility [Z00.00] 11/11/2012   Total Time spent with patient: 30 minutes  Past Psychiatric History: no new info  Past Medical History:  Past Medical History:  Diagnosis Date  . ADD (attention deficit disorder) 12/13/2013  . Adjustment disorder with mixed anxiety and depressed mood 12/13/2013  . Anxiety   . Bipolar 1 disorder, depressed, mild (HCC) 12/13/2013  . Bipolar disorder (HCC)   . Current non-smoker but past smoking history unknown   . Depression   . Depression with suicidal ideation 10/08/2013  . Major depressive disorder, recurrent episode, severe, without mention of psychotic behavior 10/14/2013  . Major depressive disorder, single episode, severe, without mention of psychotic behavior 10/14/2013  . Suicidal ideations 10/14/2013    Past Surgical History:  Procedure Laterality Date  . TONSILLECTOMY    . WISDOM TOOTH EXTRACTION     Family History: History reviewed. No pertinent family history. Family Psychiatric  History: no new info Social History:  Social History   Substance and Sexual Activity  Alcohol Use No  . Alcohol/week: 0.0 oz     Social History   Substance and Sexual Activity  Drug Use No  . Types: Cocaine, Marijuana   Comment: Former NeurosurgeonUser    Social History   Socioeconomic History  . Marital status: Single    Spouse name: Not on file  . Number of children:  Not on file  . Years of education: Not on file  . Highest education level: Not on file  Occupational History  . Not on file  Social Needs  . Financial resource strain: Not on file  . Food insecurity:    Worry: Not on file    Inability: Not on file  . Transportation needs:    Medical: Not on file    Non-medical: Not on file  Tobacco Use  . Smoking status: Former Games developermoker  . Smokeless tobacco: Never Used  Substance and Sexual Activity  . Alcohol use: No    Alcohol/week: 0.0 oz  . Drug use: No    Types: Cocaine, Marijuana    Comment: Former NeurosurgeonUser  . Sexual activity: Never  Lifestyle  . Physical activity:    Days per week: Not on file    Minutes per session: Not on file  . Stress: Not on file  Relationships  . Social connections:    Talks on phone: Not on file    Gets together: Not on file    Attends religious service: Not on file    Active member of club or organization: Not on file    Attends meetings of clubs or organizations: Not on file    Relationship status: Not on file  Other Topics Concern  . Not on file  Social History Narrative   ** Merged History Encounter **       Additional Social History:  Sleep: Good  Appetite:  Fair  Current Medications: Current Facility-Administered Medications  Medication Dose Route Frequency Provider Last Rate Last Dose  . acetaminophen (TYLENOL) tablet 650 mg  650 mg Oral Q6H PRN Pucilowska, Jolanta B, MD      . alum & mag hydroxide-simeth (MAALOX/MYLANTA) 200-200-20 MG/5ML suspension 30 mL  30 mL Oral Q4H PRN Pucilowska, Jolanta B, MD      . ARIPiprazole (ABILIFY) tablet 10 mg  10 mg Oral Daily McNew, Ileene Hutchinson, MD   10 mg at 05/17/17 1610  . hydrOXYzine (ATARAX/VISTARIL) tablet 50 mg  50 mg Oral TID PRN Pucilowska, Jolanta B, MD      . magnesium hydroxide (MILK OF MAGNESIA) suspension 30 mL  30 mL Oral Daily PRN Pucilowska, Jolanta B, MD      . OLANZapine zydis (ZYPREXA) disintegrating tablet 15  mg  15 mg Oral QHS Pucilowska, Jolanta B, MD   15 mg at 05/16/17 2111  . OLANZapine zydis (ZYPREXA) disintegrating tablet 5 mg  5 mg Oral TID PRN Pucilowska, Jolanta B, MD      . traZODone (DESYREL) tablet 100 mg  100 mg Oral QHS PRN Pucilowska, Jolanta B, MD        Lab Results: No results found for this or any previous visit (from the past 48 hour(s)).  Blood Alcohol level:  Lab Results  Component Value Date   ETH <10 04/25/2017   ETH <10 03/29/2017    Metabolic Disorder Labs: Lab Results  Component Value Date   HGBA1C 5.3 10/25/2013   MPG 105 10/25/2013   No results found for: PROLACTIN Lab Results  Component Value Date   CHOL 140 10/25/2013   TRIG 140 10/25/2013   HDL 44 10/25/2013   CHOLHDL 3.2 10/25/2013   VLDL 28 10/25/2013   LDLCALC 68 10/25/2013    Physical Findings: AIMS: Facial and Oral Movements Muscles of Facial Expression: None, normal Lips and Perioral Area: None, normal Jaw: None, normal Tongue: None, normal,Extremity Movements Upper (arms, wrists, hands, fingers): None, normal Lower (legs, knees, ankles, toes): None, normal, Trunk Movements Neck, shoulders, hips: None, normal, Overall Severity Severity of abnormal movements (highest score from questions above): None, normal Incapacitation due to abnormal movements: None, normal Patient's awareness of abnormal movements (rate only patient's report): No Awareness, Dental Status Current problems with teeth and/or dentures?: No Does patient usually wear dentures?: No  CIWA:  CIWA-Ar Total: 2 COWS:  COWS Total Score: 2  Musculoskeletal: Strength & Muscle Tone: within normal limits Gait & Station: normal Patient leans:   Psychiatric Specialty Exam: Physical Exam  Nursing note and vitals reviewed.   ROS  Blood pressure (!) 80/35, pulse (!) 130, temperature 98.4 F (36.9 C), temperature source Oral, resp. rate 18, height 5\' 3"  (1.6 m), weight 57.2 kg (126 lb), SpO2 98 %.Body mass index is 22.32  kg/m.  General Appearance: age appropriate  Eye Contact:  fair  Speech:  Clear   Volume:  Normal  Mood:  fine"  Affect:  restricted, anxious  Thought Process:   Goal Directed  Orientation:  Full (Time, Place, and Person)  Thought Content:paranoid to exboyfriend  Suicidal Thoughts:  No  Homicidal Thoughts:  No  Memory:  intact  Judgement:  fair  Insight:  Lacking  Psychomotor Activity:  Normal  Concentration:  Concentration: Fair  Recall:  good  Fund of Knowledge:  Fair  Language:  Fair  Akathisia:  No      Assets:  Resilience  ADL's:  Intact  Cognition:  WNL  Sleep:  Number of Hours: 8        Treatment Plan Summary: Daily contact with patient to assess and evaluate symptoms and progress in treatment and Medication management.  Pt  Continue to be paranoid to exboyfriend, but not aggressive. BP low,  pt asymptomatic. QTc-456. Cont current psych meds.    Monitor VS.   Beverly Sessions, MD 05/17/2017, 5:23 PMPatient ID: Debbie King, female   DOB: [birth year], 22 y.o.   MRN: [id number]

## 2017-05-18 MED ORDER — ARIPIPRAZOLE 10 MG PO TABS: 10.0000 mg | ORAL_TABLET | Freq: Every day | ORAL | 0 refills | Status: DC

## 2017-05-18 MED ORDER — OLANZAPINE 15 MG PO TABS: 15.0000 mg | ORAL_TABLET | Freq: Every day | ORAL | 0 refills | Status: DC

## 2017-05-18 NOTE — Progress Notes (Signed)
  [facility] :  Will you be returning to the same living situation after discharge:  Yes,  returning to father's home. At discharge, do you have transportation home?: Yes,  father Do you have the ability to pay for your medications: Yes,  [facility]  Release of information consent forms completed and in the chart;  Patient's signature needed at discharge.  Patient to Follow up at: Follow-up Information    [facility].   Why:  Please go to your hospital follow up appointment on Wednesday, 05/20/17 at 8:30AM. Thank you!  Contact information: [address] [place] [address] [phone]           Next level of care provider [facility] Link:no  Safety Planning and Suicide Prevention discussed: Yes,  with pt and her father  Have you used any form of tobacco in the last 30 days? (Cigarettes, Smokeless Tobacco, Cigars, and/or Pipes): No  Has patient been referred to the Quitline?: N/A patient is not a smoker  Patient has been referred for addiction treatment: N/A  Heidi DachKelsey Chaske Paskett, LCSW 05/18/2017, 8:58 AM

## 2017-05-18 NOTE — [facility] Suicide Risk Assessment (Signed)
[facility]   Principal Problem: Schizoaffective disorder, bipolar type [facility](HCC) Discharge Diagnoses:  Patient Active Problem List   Diagnosis Date Noted  . Schizoaffective disorder, bipolar type (HCC) [F25.0] 03/31/2017    Priority: High  . Noncompliance [Z91.19] 03/30/2017  . Routine general medical examination at a health care facility [Z00.00] 11/11/2012   Mental Status Per Nursing Assessment::   On Admission:  Self-harm behaviors  Demographic Factors:  Caucasian and Unemployed  Loss Factors: NA  Historical Factors: Impulsivity  Risk Reduction Factors:   Living with another person, especially a relative and Positive social support  Continued Clinical Symptoms:  Poor insight  Cognitive Features That Contribute To Risk:  None    Suicide Risk:  Minimal: No identifiable suicidal ideation.    Follow-up Information    [facility].   Why:  Please go to your hospital follow up appointment on Wednesday, 05/20/17 at 8:30AM. Thank you!  Contact information: [address] [place] [address] [phone]           Plan Of Care/Follow-up recommendations: [facility]  Cherilynn Schomburg R Evonda Enge, MD 05/18/2017, 8:49 AM

## 2017-05-18 NOTE — Plan of Care (Signed)
Compliant with treatment. Mood improving. Thougth process organized

## 2017-05-18 NOTE — Discharge Summary (Signed)
Physician Discharge Summary Note  Patient:  Debbie King is an 22 y.o., female MRN:  [id number] DOB:  Nov 25, 1995 Patient phone:  [phone] (home)  Patient address:   [address] [place] [address],  Total Time spent with patient: 15 minutes  Plus 20 minutes of medication reconciliation, discharge planning, and discahrge documentation   Date of Admission:  05/13/2017 Date of Discharge: 05/18/17  Reason for Admission:  Psychosis  Principal Problem: Schizoaffective disorder, bipolar type [facility]) Discharge Diagnoses: Patient Active Problem List   Diagnosis Date Noted  . Schizoaffective disorder, bipolar type (HCC) [F25.0] 03/31/2017    Priority: High  . Noncompliance [Z91.19] 03/30/2017  . Routine general medical examination at a health care facility [Z00.00] 11/11/2012    Past Psychiatric History: See H&p  Past Medical History:  Past Medical History:  Diagnosis Date  . ADD (attention deficit disorder) 12/13/2013  . Adjustment disorder with mixed anxiety and depressed mood 12/13/2013  . Anxiety   . Bipolar 1 disorder, depressed, mild (HCC) 12/13/2013  . Bipolar disorder (HCC)   . Current non-smoker but past smoking history unknown   . Depression   . Depression with suicidal ideation 10/08/2013  . Major depressive disorder, recurrent episode, severe, without mention of psychotic behavior 10/14/2013  . Major depressive disorder, single episode, severe, without mention of psychotic behavior 10/14/2013  . Suicidal ideations 10/14/2013    Past Surgical History:  Procedure Laterality Date  . TONSILLECTOMY    . WISDOM TOOTH EXTRACTION     Family History: History reviewed. No pertinent family history. Family Psychiatric  History: See H&P Social History:  Social History   Substance and Sexual Activity  Alcohol Use No  . Alcohol/week: 0.0 oz     Social History   Substance and Sexual Activity  Drug Use No  . Types: Cocaine, Marijuana   Comment: Former Neurosurgeon     Social History   Socioeconomic History  . Marital status: Single    Spouse name: Not on file  . Number of children: Not on file  . Years of education: Not on file  . Highest education level: Not on file  Occupational History  . Not on file  Social Needs  . Financial resource strain: Not on file  . Food insecurity:    Worry: Not on file    Inability: Not on file  . Transportation needs:    Medical: Not on file    Non-medical: Not on file  Tobacco Use  . Smoking status: Former Games developer  . Smokeless tobacco: Never Used  Substance and Sexual Activity  . Alcohol use: No    Alcohol/week: 0.0 oz  . Drug use: No    Types: Cocaine, Marijuana    Comment: Former Neurosurgeon  . Sexual activity: Never  Lifestyle  . Physical activity:    Days per week: Not on file    Minutes per session: Not on file  . Stress: Not on file  Relationships  . Social connections:    Talks on phone: Not on file    Gets together: Not on file    Attends religious service: Not on file    Active member of club or organization: Not on file    Attends meetings of clubs or organizations: Not on file    Relationship status: Not on file  Other Topics Concern  . Not on file  Social History Narrative   ** Merged History Encounter [facility] Course:  Pt was  restarted on Zyprexa and increased to 15 mg qhs. She was initially stating that she did not want to take Zyprexa because it was making her nauseated. She was started on Abilify to assess for tolerability with expectation that she would need LAI given her level of psychosis at last hospitalization. However, she was much calmer and much more organized this hospitalization. She was medication compliant with Zyprexa and Abilify. She attended some groups. She did not display any symptoms of paranoia and delusions were not prominent like last admission. She did not display any unsafe behaviors on the unit. She consistently denied SI. Pt could be tapered of Abilify in  the future if she remains compliant with oral Zyprexa. She is very focused on Adderall. We discussed that I do not recommend this medication as it has potential to make psychosis worse. On day of discharge, she had brighter affect. Hygiene was improved. She was sleeping well. Denied SI, HI, AH, VH. She was very organized and goal directed. She did not have any delusional thought content today. She was adamant that she did not want [place] speaking with her mother. Per CSW, her father was comfortable with her discharging.   The patient is at low risk of imminent suicide. Patient denied thoughts, intent, or plan for harm to self or others, expressed significant future orientation, and expressed an ability to mobilize assistance for her needs. She is presently void of any contributing psychiatric symptoms, cognitive difficulties, or substance use which would elevate her risk for lethality. Chronic risk for lethality is elevated in light of impulsivity, history of medication noncompliance, poor insight. The chronic risk is presently mitigated by her ongoing desire and engagement in [facility] treatment and mobilization of support from family and friends. Chronic risk may elevate if he/she experiences any significant loss or worsening of symptoms, which can be managed and monitored through outpatient providers. At this time,a cute risk for lethality is low and she is stable for ongoing outpatient management.   Modifiable risk factors were addressed during this hospitalization through appropriate pharmacotherapy and establishment of outpatient follow-up treatment. Some risk factors for suicide are situational (i.e. Unstable housing) or related personality pathology (i.e. Poor coping mechanisms) and thus cannot be further mitigated by continued hospitalization in this setting.    Physical Findings: AIMS: Facial and Oral Movements Muscles of Facial Expression: None, normal Lips and Perioral Area: None, normal Jaw: None,  normal Tongue: None, normal,Extremity Movements Upper (arms, wrists, hands, fingers): None, normal Lower (legs, knees, ankles, toes): None, normal, Trunk Movements Neck, shoulders, hips: None, normal, Overall Severity Severity of abnormal movements (highest score from questions above): None, normal Incapacitation due to abnormal movements: None, normal Patient's awareness of abnormal movements (rate only patient's report): No Awareness, Dental Status Current problems with teeth and/or dentures?: No Does patient usually wear dentures?: No  CIWA:  CIWA-Ar Total: 2 COWS:  COWS Total Score: 2  Musculoskeletal: Strength & Muscle Tone: within normal limits Gait & Station: normal Patient leans: N/A  Psychiatric Specialty Exam: Physical Exam  Nursing note and vitals reviewed.   Review of Systems  All other systems reviewed and are negative.   Blood pressure 92/68, pulse (!) 119, temperature 98.1 F (36.7 C), temperature source Oral, resp. rate 18, height 5\' 3"  (1.6 m), weight 57.2 kg (126 lb), SpO2 97 %.Body mass index is 22.32 kg/m.  General Appearance: Casual  Eye Contact:  Good  Speech:  Clear and Coherent  Volume:  Normal  Mood:  Euthymic  Affect:  Congruent  Thought Process:  Coherent and Goal Directed  Orientation:  Full (Time, Place, and Person)  Thought Content:  Logical  Suicidal Thoughts:  No  Homicidal Thoughts:  No  Memory:  Recent;   Fair  Judgement:  Fair  Insight:  Lacking  Psychomotor Activity:  Normal  Concentration:  Concentration: Fair  Recall:  Fair  Fund of Knowledge:  Fair  Language:  Fair  Akathisia:  No      Assets:  Resilience  ADL's:  Intact  Cognition:  WNL  Sleep:  Number of Hours: 7     Have you used any form of tobacco in the last 30 days? (Cigarettes, Smokeless Tobacco, Cigars, and/or Pipes): No  Has this patient used any form of tobacco in the last 30 days? (Cigarettes, Smokeless Tobacco, Cigars, and/or Pipes) No  Blood Alcohol  level:  Lab Results  Component Value Date   ETH <10 04/25/2017   ETH <10 03/29/2017    Metabolic Disorder Labs:  Lab Results  Component Value Date   HGBA1C 5.3 10/25/2013   MPG 105 10/25/2013   No results found for: PROLACTIN Lab Results  Component Value Date   CHOL 140 10/25/2013   TRIG 140 10/25/2013   HDL 44 10/25/2013   CHOLHDL 3.2 10/25/2013   VLDL 28 10/25/2013   LDLCALC 68 10/25/2013    See Psychiatric Specialty Exam and Suicide Risk Assessment completed by Attending Physician prior to discharge.  Discharge destination:  Home  Is patient on multiple antipsychotic therapies at discharge:  Yes,   Do you recommend tapering to monotherapy for antipsychotics?  Yes   Has Patient had three or more failed trials of antipsychotic monotherapy by history:  Yes,   Antipsychotic medications that previously failed include:   1.  Invega., 2.  Haldol. and 3.  Zyprexa.  Recommended Plan for Multiple Antipsychotic Therapies: Taper to monotherapy as described:  She may discontinue Abilfiy in the future if she remains compliant with oral Zyprexa. Orlinda Blalockbilfy was trialed for possible need of LAI   Allergies as of 05/18/2017      Reactions   Citalopram Itching   Haldol [haloperidol Lactate] Other (See Comments)   Something happened to jaw       Medication List    STOP taking these medications   amphetamine-dextroamphetamine 10 MG tablet Commonly known as:  ADDERALL   hydrOXYzine 25 MG capsule Commonly known as:  VISTARIL   paliperidone 234 MG/1.5ML Susp injection Commonly known as:  INVEGA SUSTENNA     TAKE these medications     Indication  ARIPiprazole 10 MG tablet Commonly known as:  ABILIFY Take 1 tablet (10 mg total) by mouth daily. Start taking on:  05/19/2017  Indication:  Schizophrenia   OLANZapine 15 MG tablet Commonly known as:  ZYPREXA Take 1 tablet (15 mg total) by mouth at bedtime. What changed:    medication strength  how much to take  Indication:   Schizophrenia      Follow-up Information    [facility]. Go on 05/20/2017.   Why:  Please go to your hospital follow up appointment on Wednesday, 05/20/17 at 8:30AM. Thank you!  Contact information: [address] [place] [address] [phone]           Follow-up recommendations: Follow up with [facility] Signed: Haskell RilingHolly R McNew, MD 05/18/2017, 8:50 AM

## 2017-05-18 NOTE — Progress Notes (Signed)
D: Patient is aware of  Discharge this shift .Patient denies suicidal /homicidal ideations. Patient received all belongings brought in  A:received  Storage medications. Writer reviewed Discharge Summary, Suicide Risk Assessment, and Transitional Record. Patient also received Prescriptions   from  MD.   Aware  Of follow up appointment . R: Patient left unit with no questions  Or concerns  With both parents

## 2017-05-18 NOTE — Progress Notes (Signed)
Recreation Therapy Notes   Date: 05/18/2017  Time: 9:30 am   Location: Craft Room   Behavioral response: N/A   Intervention Topic: Values   Discussion/Intervention: Patient did not attend group.   Clinical Observations/Feedback:  Patient did not attend group.   Myanna Ziesmer LRT/CTRS        Koralee Wedeking 05/18/2017 10:29 AM 

## 2017-05-18 NOTE — Progress Notes (Signed)
Patient stayed in the milieu until bed time. Alert and oriented and denying thoughts of self harm Patient reported that she is feeling better "I think that my medications are helping me ". Patient was seen in evening activities and remained appropriate. Went to bed after snack and has been sleeping since. No sign of distress. Staff continue to monitor.

## 2018-09-07 ENCOUNTER — Ambulatory Visit: Payer: Self-pay | Admitting: Family

## 2020-12-05 ENCOUNTER — Other Ambulatory Visit: Payer: Self-pay

## 2020-12-05 ENCOUNTER — Ambulatory Visit: Payer: Medicaid Other | Admitting: Family Medicine

## 2020-12-05 ENCOUNTER — Other Ambulatory Visit (HOSPITAL_COMMUNITY)
Admission: RE | Admit: 2020-12-05 | Discharge: 2020-12-05 | Disposition: A | Discharge status: Home / Self Care | Payer: Medicaid Other | Source: Ambulatory Visit | Attending: Family Medicine | Admitting: Family Medicine

## 2020-12-05 ENCOUNTER — Encounter: Payer: Self-pay | Admitting: Family Medicine

## 2020-12-05 VITALS — BP 100/63 | HR 102 | Ht 63.0 in | Wt 118.0 lb

## 2020-12-05 DIAGNOSIS — Z113 Encounter for screening for infections with a predominantly sexual mode of transmission: Secondary | ICD-10-CM

## 2020-12-05 DIAGNOSIS — B977 Papillomavirus as the cause of diseases classified elsewhere: Secondary | ICD-10-CM

## 2020-12-05 DIAGNOSIS — Z01419 Encounter for gynecological examination (general) (routine) without abnormal findings: Secondary | ICD-10-CM

## 2020-12-05 NOTE — Progress Notes (Signed)
GYNECOLOGY ANNUAL PREVENTATIVE CARE ENCOUNTER NOTE  Subjective:   Debbie King is a 25 y.o. G0P0000 female here for a routine annual gynecologic exam.  Current complaints: none.   Denies abnormal vaginal bleeding, discharge, pelvic pain, problems with intercourse or other gynecologic concerns.    Gynecologic History Patient's last menstrual period was 11/26/2020 (within days). Patient is sexually active  Contraception: coitus interruptus Last Pap: 2021. Results were: + HPV Last mammogram: n/a Colorectal Cancer Screening: n/1  Obstetric History OB History  Gravida Para Term Preterm AB Living  0 0 0 0 0 0  SAB IAB Ectopic Multiple Live Births  0 0 0 0 0    Past Medical History:  Diagnosis Date   ADD (attention deficit disorder) 12/13/2013   Adjustment disorder with mixed anxiety and depressed mood 12/13/2013   Anxiety    Bipolar 1 disorder, depressed, mild (HCC) 12/13/2013   Bipolar disorder (HCC)    Current non-smoker but past smoking history unknown    Depression    Depression with suicidal ideation 10/08/2013   Major depressive disorder, recurrent episode, severe, without mention of psychotic behavior 10/14/2013   Major depressive disorder, single episode, severe, without mention of psychotic behavior 10/14/2013   Suicidal ideations 10/14/2013   Vaginal Pap smear, abnormal     Past Surgical History:  Procedure Laterality Date   TONSILLECTOMY     WISDOM TOOTH EXTRACTION      Current Outpatient Medications on File Prior to Visit  Medication Sig Dispense Refill   clonazePAM (KLONOPIN) 0.5 MG tablet Take 0.5 mg by mouth 3 (three) times daily as needed.     [facility] PAIN RELIEF 4 % CREA SMARTSIG:1 Application Topical     hydrOXYzine (ATARAX/VISTARIL) 25 MG tablet Take 25 mg by mouth daily.     LATUDA 60 MG TABS Take 1 tablet by mouth every morning.     spironolactone (ALDACTONE) 50 MG tablet Take 50 mg by mouth daily.     trihexyphenidyl (ARTANE) 2 MG tablet  Take 2 mg by mouth 2 (two) times daily.     No current facility-administered medications on file prior to visit.    Allergies  Allergen Reactions   Citalopram Itching   Haldol [Haloperidol Lactate] Other (See Comments)    Something happened to jaw     Social History   Socioeconomic History   Marital status: Single    Spouse name: Not on file   Number of children: Not on file   Years of education: Not on file   Highest education level: Not on file  Occupational History   Not on file  Tobacco Use   Smoking status: Former   Smokeless tobacco: Never  Vaping Use   Vaping Use: Never used  Substance and Sexual Activity   Alcohol use: Yes   Drug use: No    Types: Cocaine, Marijuana    Comment: Former Neurosurgeon   Sexual activity: Yes    Birth control/protection: Coitus interruptus  Other Topics Concern   Not on file  Social History Narrative   ** Merged History Encounter **       Social Determinants of Health   Financial Resource Strain: Not on file  Food Insecurity: Not on file  Transportation Needs: Not on file  Physical Activity: Not on file  Stress: Not on file  Social Connections: Not on file  Intimate Partner Violence: Not on file    Family History  Problem Relation Age of Onset   Cancer Neg Hx  Diabetes Neg Hx    Hypertension Neg Hx     The following portions of the patient's history were reviewed and updated as appropriate: allergies, current medications, past family history, past medical history, past social history, past surgical history and problem list.  Review of Systems Pertinent items are noted in HPI.   Objective:  BP 100/63   Pulse (!) 102   Ht 5\' 3"  (1.6 m)   Wt 118 lb (53.5 kg)   LMP 11/26/2020 (Within Days)   BMI 20.90 kg/m  Wt Readings from Last 3 Encounters:  12/05/20 118 lb (53.5 kg)  04/25/17 125 lb (56.7 kg)  03/29/17 130 lb (59 kg)     Chaperone present during exam  CONSTITUTIONAL: Well-developed, well-nourished female in no  acute distress.  HENT:  Normocephalic, atraumatic, External right and left ear normal. Oropharynx is clear and moist EYES: Conjunctivae and EOM are normal. Pupils are equal, round, and reactive to light. No scleral icterus.  NECK: Normal range of motion, supple, no masses.  Normal thyroid.   CARDIOVASCULAR: Normal heart rate noted, regular rhythm RESPIRATORY: Clear to auscultation bilaterally. Effort and breath sounds normal, no problems with respiration noted. BREASTS: Symmetric in size. No masses, skin changes, nipple drainage, or lymphadenopathy. ABDOMEN: Soft, normal bowel sounds, no distention noted.  No tenderness, rebound or guarding.  PELVIC: Normal appearing external genitalia; normal appearing vaginal mucosa and cervix.  No abnormal discharge noted.   MUSCULOSKELETAL: Normal range of motion. No tenderness.  No cyanosis, clubbing, or edema.  2+ distal pulses. SKIN: Skin is warm and dry. No rash noted. Not diaphoretic. No erythema. No pallor. NEUROLOGIC: Alert and oriented to person, place, and time. Normal reflexes, muscle tone coordination. No cranial nerve deficit noted. PSYCHIATRIC: Normal mood and affect. Normal behavior. Normal judgment and thought content.  Assessment:  Annual gynecologic examination with pap smear   Plan:  1. Well Woman Exam Will follow up results of pap smear and manage accordingly. STD testing discussed. Patient requested testing - Cytology - PAP( [place])  2. 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 Please refer to After Visit Summary for other counseling recommendations.    03/31/17, DO [facility]

## 2020-12-05 NOTE — Addendum Note (Signed)
Addended by: Anell Barr on: 12/05/2020 09:36 AM   Modules accepted: Orders

## 2020-12-06 LAB — HEPATITIS C ANTIBODY: Hep C Virus Ab: <0.1 s/co ratio (ref 0.0–0.9)

## 2020-12-06 LAB — RPR: RPR Ser Ql: NONREACTIVE

## 2020-12-06 LAB — HIV ANTIBODY (ROUTINE TESTING W REFLEX): HIV Screen 4th Generation wRfx: NONREACTIVE

## 2020-12-06 LAB — HEPATITIS B SURFACE ANTIGEN: Hepatitis B Surface Ag: NEGATIVE

## 2020-12-07 LAB — CYTOLOGY - PAP
Adequacy: ABSENT
Chlamydia: NEGATIVE
Comment: NEGATIVE
Comment: NEGATIVE
Comment: NORMAL
Diagnosis: NEGATIVE
High risk HPV: NEGATIVE
Neisseria Gonorrhea: NEGATIVE

## 2020-12-31 ENCOUNTER — Other Ambulatory Visit: Payer: Self-pay

## 2020-12-31 MED ORDER — IBUPROFEN 600 MG PO TABS: 600.0000 mg | ORAL_TABLET | Freq: Four times a day (QID) | ORAL | 3 refills | Status: DC | PRN

## 2021-02-25 ENCOUNTER — Other Ambulatory Visit: Payer: Self-pay

## 2021-02-25 MED ORDER — FLUCONAZOLE 150 MG PO TABS: 150.0000 mg | ORAL_TABLET | Freq: Once | ORAL | 0 refills | Status: AC

## 2021-02-25 NOTE — Progress Notes (Signed)
Patient called stating she is having yeast infections symptoms- itching and white discharge. Has used diflucan in the past and it worked well. Armandina Stammer RN

## 2021-09-19 ENCOUNTER — Other Ambulatory Visit: Payer: Self-pay

## 2021-09-19 MED ORDER — IBUPROFEN 600 MG PO TABS: 600.0000 mg | ORAL_TABLET | Freq: Four times a day (QID) | ORAL | 2 refills | Status: DC | PRN

## 2021-10-24 ENCOUNTER — Telehealth: Payer: Medicaid Other | Admitting: Physician Assistant

## 2021-10-24 ENCOUNTER — Encounter: Payer: Self-pay | Admitting: Family Medicine

## 2021-10-24 DIAGNOSIS — R6882 Decreased libido: Secondary | ICD-10-CM

## 2021-10-24 NOTE — Progress Notes (Signed)
Virtual Visit Consent   Debbie King, you are scheduled for a virtual visit with a [facility] provider today. Just as with appointments in the office, your consent must be obtained to participate. Your consent will be active for this visit and any virtual visit you may have with one of our providers in the next 365 days. If you have a MyChart account, a copy of this consent can be sent to you electronically.  As this is a virtual visit, video technology does not allow for your provider to perform a traditional examination. This may limit your provider's ability to fully assess your condition. If your provider identifies any concerns that need to be evaluated in person or the need to arrange testing (such as labs, EKG, etc.), we will make arrangements to do so. Although advances in technology are sophisticated, we cannot ensure that it will always work on either your end or our end. If the connection with a video visit is poor, the visit may have to be switched to a telephone visit. With either a video or telephone visit, we are not always able to ensure that we have a secure connection.  By engaging in this virtual visit, you consent to the provision of healthcare and authorize for your insurance to be billed (if applicable) for the services provided during this visit. Depending on your insurance coverage, you may receive a charge related to this service.  I need to obtain your verbal consent now. Are you willing to proceed with your visit today? Debbie King has provided verbal consent on 10/24/2021 for a virtual visit (video or telephone). Debbie King, [place]  Date: 10/24/2021 5:00 PM  Virtual Visit via Video Note   I, Debbie King, connected with  Debbie King  ([id number], Jan 06, 1996) on 10/24/21 at  5:00 PM EDT by a video-enabled telemedicine application and verified that I am speaking with the correct person using two identifiers.  Location: Patient:  Virtual Visit Location Patient: Home Provider: Virtual Visit Location Provider: Home Office   I discussed the limitations of evaluation and management by telemedicine and the availability of in person appointments. The patient expressed understanding and agreed to proceed.    History of Present Illness: Debbie King is a 26 y.o. who identifies as a female who was assigned female at birth, and is being seen today for request of medication to help with low sex drive as a side effect from her psychiatric medications. Notes this has been an ongoing issue but she decided she would finally like to do something about it. Was told by pharmacist there were medications that could help with this. Marland Kitchen  HPI: HPI  Problems:  Patient Active Problem List   Diagnosis Date Noted   Schizoaffective disorder, bipolar type (HCC) 03/31/2017   Noncompliance 03/30/2017   Routine general medical examination at a health care facility 11/11/2012    Allergies:  Allergies  Allergen Reactions   Citalopram Itching   Haldol [Haloperidol Lactate] Other (See Comments)    Something happened to jaw    Medications:  Current Outpatient Medications:    clonazePAM (KLONOPIN) 0.5 MG tablet, Take 0.5 mg by mouth 3 (three) times daily as needed., Disp: , Rfl:    [employer] PAIN RELIEF 4 % CREA, SMARTSIG:1 Application Topical, Disp: , Rfl:    hydrOXYzine (ATARAX/VISTARIL) 25 MG tablet, Take 25 mg by mouth daily., Disp: , Rfl:    ibuprofen (ADVIL) 600 MG tablet, Take 1 tablet (600 mg total)  by mouth every 6 (six) hours as needed., Disp: 30 tablet, Rfl: 2   LATUDA 60 MG TABS, Take 1 tablet by mouth every morning., Disp: , Rfl:    spironolactone (ALDACTONE) 50 MG tablet, Take 50 mg by mouth daily., Disp: , Rfl:    trihexyphenidyl (ARTANE) 2 MG tablet, Take 2 mg by mouth 2 (two) times daily., Disp: , Rfl:   Observations/Objective: Patient is well-developed, well-nourished in no acute distress.  Resting comfortably at home.   Head is normocephalic, atraumatic.  No labored breathing. Speech is clear and coherent with logical content.  Patient is alert and oriented at baseline.   Assessment and Plan: 1. Low libido  Agree giving young age this is most likely related to her psychiatric medications but does need further evaluation. She wa made aware we do not treat chronic issues or adjust chronic psychiatric medication regimens via video urgent care visit. She is to contact her GYN and psychiatrist for further evaluation and management.  No charge for visit.   Follow Up Instructions: I discussed the assessment and treatment plan with the patient. The patient was provided an opportunity to ask questions and all were answered. The patient agreed with the plan and demonstrated an understanding of the instructions.  A copy of instructions were sent to the patient via MyChart unless otherwise noted below.   The patient was advised to call back or seek an in-person evaluation if the symptoms worsen or if the condition fails to improve as anticipated.  Time:  I spent 8 minutes with the patient via telehealth technology discussing the above problems/concerns.    Leeanne Rio, PA-C

## 2021-10-25 ENCOUNTER — Telehealth: Payer: Self-pay

## 2021-10-25 NOTE — Telephone Encounter (Signed)
Patient called and states that she saw her PCP yesterday who prescribed something for her low libido. Kathrene Alu RN

## 2021-11-06 ENCOUNTER — Ambulatory Visit: Payer: Medicaid Other | Admitting: Family Medicine

## 2021-12-18 ENCOUNTER — Encounter: Payer: Self-pay | Admitting: Family Medicine

## 2021-12-18 ENCOUNTER — Ambulatory Visit: Payer: Medicaid Other | Admitting: Family Medicine

## 2021-12-18 ENCOUNTER — Other Ambulatory Visit (HOSPITAL_COMMUNITY)
Admission: RE | Admit: 2021-12-18 | Discharge: 2021-12-18 | Disposition: A | Discharge status: Home / Self Care | Payer: Medicaid Other | Source: Ambulatory Visit | Attending: Family Medicine | Admitting: Family Medicine

## 2021-12-18 VITALS — BP 100/64 | HR 105 | Ht 63.0 in | Wt 128.0 lb

## 2021-12-18 DIAGNOSIS — Z0000 Encounter for general adult medical examination without abnormal findings: Secondary | ICD-10-CM

## 2021-12-18 DIAGNOSIS — Z7721 Contact with and (suspected) exposure to potentially hazardous body fluids: Secondary | ICD-10-CM

## 2021-12-18 DIAGNOSIS — Z01419 Encounter for gynecological examination (general) (routine) without abnormal findings: Secondary | ICD-10-CM

## 2021-12-18 MED ORDER — IBUPROFEN 600 MG PO TABS: 600.0000 mg | ORAL_TABLET | Freq: Four times a day (QID) | ORAL | 6 refills | Status: AC | PRN

## 2021-12-18 NOTE — Progress Notes (Signed)
ANNUAL EXAM Patient name: Debbie King MRN [id number]  Date of birth: 19-Dec-1995 Chief Complaint:   Annual Exam  History of Present Illness:   Debbie King is a 26 y.o.  G0P0000  female  being seen today for a routine annual exam.  Current complaints: cramping during menses - ibuprofen helpful. Birth control method - withdrawal method. Does not want to be on hormonal contraception.  Patient's last menstrual period was 12/09/2021 (approximate).    Last pap 2022. Results were: NILM w/ HRHPV negative. H/O abnormal pap: no Last mammogram: n/a     12/05/2020    9:06 AM  Depression screen PHQ 2/9  Decreased Interest 0  Down, Depressed, Hopeless 0  PHQ - 2 Score 0  Tired, decreased energy 0  Change in appetite 0  Feeling bad or failure about yourself  0  Trouble concentrating 0  Moving slowly or fidgety/restless 0  Suicidal thoughts 0        12/05/2020    9:06 AM  GAD 7 : Generalized Anxiety Score  Nervous, Anxious, on Edge 0  Control/stop worrying 0  Worry too much - different things 0  Trouble relaxing 0  Restless 0  Easily annoyed or irritable 0  Afraid - awful might happen 0  Total GAD 7 Score 0     Review of Systems:   Pertinent items are noted in HPI Denies any headaches, blurred vision, fatigue, shortness of breath, chest pain, abdominal pain, abnormal vaginal discharge/itching/odor/irritation, problems with periods, bowel movements, urination, or intercourse unless otherwise stated above. Pertinent History Reviewed:  Reviewed past medical,surgical, social and family history.  Reviewed problem list, medications and allergies. Physical Assessment:   Vitals:   12/18/21 1038  BP: 100/64  Pulse: (!) 105  Weight: 128 lb (58.1 kg)  Height: 5\' 3"  (1.6 m)  Body mass index is 22.67 kg/m.        Physical Examination:   General appearance - well appearing, and in no distress  Mental status - alert, oriented to person, place, and  time  Psych:  She has a normal mood and affect  Skin - warm and dry, normal color, no suspicious lesions noted  Chest - effort normal, all lung fields clear to auscultation bilaterally  Heart - normal rate and regular rhythm  Neck:  midline trachea, no thyromegaly or nodules  Breasts - breasts appear normal, no suspicious masses, no skin or nipple changes or axillary nodes  Abdomen - soft, nontender, nondistended, no masses or organomegaly  Pelvic - VULVA: normal appearing vulva with no masses, tenderness or lesions  VAGINA: normal appearing vagina with normal color and discharge, no lesions  CERVIX: normal appearing cervix without discharge or lesions, no CMT  Thin prep pap is done with HR HPV cotesting  UTERUS: uterus is felt to be normal size, shape, consistency and nontender   ADNEXA: No adnexal masses or tenderness noted.  Extremities:  No swelling or varicosities noted  Chaperone present for exam  Assessment & Plan:  1. Well woman exam with routine gynecological exam Patient would like PAP today - HIV antibody (with reflex) - Hepatitis C Antibody - Hepatitis B Surface AntiGEN - RPR - Cytology - PAP( [place])  2. Exposure to body fluid - HIV antibody (with reflex) - Hepatitis C Antibody - Hepatitis B Surface AntiGEN - RPR - Cytology - PAP( [place])   No orders of the defined types were placed in this encounter.   Meds: No orders of the  defined types were placed in this encounter.   Follow-up: No follow-ups on file.  Levie Heritage, DO 12/18/2021 11:20 AM

## 2021-12-19 LAB — RPR: RPR Ser Ql: NONREACTIVE

## 2021-12-19 LAB — HIV ANTIBODY (ROUTINE TESTING W REFLEX): HIV Screen 4th Generation wRfx: NONREACTIVE

## 2021-12-19 LAB — HEPATITIS C ANTIBODY: Hep C Virus Ab: NONREACTIVE

## 2021-12-19 LAB — HEPATITIS B SURFACE ANTIGEN: Hepatitis B Surface Ag: NEGATIVE

## 2021-12-20 LAB — CYTOLOGY - PAP
Adequacy: ABSENT
Chlamydia: NEGATIVE
Comment: NEGATIVE
Comment: NEGATIVE
Comment: NORMAL
Diagnosis: NEGATIVE
Neisseria Gonorrhea: NEGATIVE
Trichomonas: NEGATIVE

## 2021-12-29 ENCOUNTER — Ambulatory Visit
Admission: EM | Admit: 2021-12-29 | Discharge: 2021-12-29 | Disposition: A | Discharge status: Home / Self Care | Payer: Medicaid Other | Attending: Urgent Care | Admitting: Urgent Care

## 2021-12-29 DIAGNOSIS — B3731 Acute candidiasis of vulva and vagina: Secondary | ICD-10-CM

## 2021-12-29 MED ORDER — FLUCONAZOLE 150 MG PO TABS: 150.0000 mg | ORAL_TABLET | ORAL | 0 refills | Status: AC

## 2021-12-29 NOTE — ED Triage Notes (Signed)
Pt reports she thinks she has a yeast infection. Itching and is on her menstrual so she can't tell if she has discharge or not.

## 2021-12-29 NOTE — ED Provider Notes (Signed)
[facility]  Note:  This document was prepared using Systems analyst and may include unintentional dictation errors.  MRN: [id number] DOB: [birth year]  Subjective:   Debbie King is a 26 y.o. female presenting for several day history of acute onset vaginal itching and irritation, pelvic cramping.  Of note, patient just had a Pap smear done and had fungal organisms present.  All other testing was normal.  She did have some pelvic cramping at the time but this was not treated as a yeast infection.  Patient does not want a repeat a vaginal test.  No current facility-administered medications for this encounter.  Current Outpatient Medications:    clomiPRAMINE (ANAFRANIL) 25 MG capsule, Take 25 mg by mouth 2 (two) times daily., Disp: , Rfl:    clonazePAM (KLONOPIN) 0.5 MG tablet, Take 0.5 mg by mouth 3 (three) times daily as needed., Disp: , Rfl:    [employer]S PAIN RELIEF 4 % CREA, SMARTSIG:1 Application Topical, Disp: , Rfl:    hydrOXYzine (ATARAX/VISTARIL) 25 MG tablet, Take 25 mg by mouth daily., Disp: , Rfl:    ibuprofen (ADVIL) 600 MG tablet, Take 1 tablet (600 mg total) by mouth every 6 (six) hours as needed., Disp: 30 tablet, Rfl: 6   LATUDA 60 MG TABS, Take 1 tablet by mouth every morning., Disp: , Rfl:    spironolactone (ALDACTONE) 50 MG tablet, Take 50 mg by mouth daily., Disp: , Rfl:    trihexyphenidyl (ARTANE) 2 MG tablet, Take 2 mg by mouth 2 (two) times daily., Disp: , Rfl:    Vitamin D, Ergocalciferol, (DRISDOL) 1.25 MG (50000 UNIT) CAPS capsule, Take 50,000 Units by mouth every 7 (seven) days., Disp: , Rfl:    Allergies  Allergen Reactions   Citalopram Itching   Haldol [Haloperidol Lactate] Other (See Comments)    Something happened to jaw     Past Medical History:  Diagnosis Date   ADD (attention deficit disorder) 12/13/2013   Adjustment disorder with mixed anxiety and depressed mood 12/13/2013   Anxiety    Bipolar 1  disorder, depressed, mild ([place]) 12/13/2013   Bipolar disorder ([place])    Current non-smoker but past smoking history unknown    Depression    Depression with suicidal ideation 10/08/2013   Major depressive disorder, recurrent episode, severe, without mention of psychotic behavior 10/14/2013   Major depressive disorder, single episode, severe, without mention of psychotic behavior 10/14/2013   Suicidal ideations 10/14/2013   Vaginal Pap smear, abnormal      Past Surgical History:  Procedure Laterality Date   TONSILLECTOMY     WISDOM TOOTH EXTRACTION      Family History  Problem Relation Age of Onset   Cancer Neg Hx    Diabetes Neg Hx    Hypertension Neg Hx     Social History   Tobacco Use   Smoking status: Former   Smokeless tobacco: Never  Scientific laboratory technician Use: Never used  Substance Use Topics   Alcohol use: Yes   Drug use: No    Types: Cocaine, Marijuana    Comment: Former Systems developer    ROS   Objective:   Vitals: BP 113/76 (BP Location: Left Arm)   Pulse 82   Temp 97.9 F (36.6 C) (Oral)   Resp 20   LMP 12/29/2021 (Approximate)   SpO2 95%   Physical Exam Constitutional:      General: She is not in acute distress.    Appearance: Normal  appearance. She is well-developed. She is not ill-appearing, toxic-appearing or diaphoretic.  HENT:     Head: Normocephalic and atraumatic.     Nose: Nose normal.     Mouth/Throat:     Mouth: Mucous membranes are moist.  Eyes:     General: No scleral icterus.       Right eye: No discharge.        Left eye: No discharge.     Extraocular Movements: Extraocular movements intact.     Conjunctiva/sclera: Conjunctivae normal.  Cardiovascular:     Rate and Rhythm: Normal rate.  Pulmonary:     Effort: Pulmonary effort is normal.  Abdominal:     General: Bowel sounds are normal. There is no distension.     Palpations: Abdomen is soft. There is no mass.     Tenderness: There is no abdominal tenderness. There is no right CVA  tenderness, left CVA tenderness, guarding or rebound.  Skin:    General: Skin is warm and dry.  Neurological:     General: No focal deficit present.     Mental Status: She is alert and oriented to person, place, and time.  Psychiatric:        Mood and Affect: Mood normal.        Behavior: Behavior normal.        Thought Content: Thought content normal.        Judgment: Judgment normal.    Recent Results (from the past 2160 hour(s))  Cytology - PAP( [place])     Status: None   Collection Time: 12/18/21 11:21 AM  Result Value Ref Range   Neisseria Gonorrhea Negative    Chlamydia Negative    Trichomonas Negative    Adequacy      Satisfactory for evaluation; transformation zone component ABSENT.   Diagnosis      - Negative for intraepithelial lesion or malignancy (NILM)   Microorganisms      Fungal organisms present consistent with Candida spp.   Comment Normal Reference Range Trichomonas - Negative    Comment Normal Reference [place] Chlamydia - Negative    Comment      Normal Reference Range Neisseria Gonorrhea - Negative  HIV antibody (with reflex)     Status: None   Collection Time: 12/18/21 11:28 AM  Result Value Ref Range   HIV Screen 4th Generation wRfx Non Reactive Non Reactive    Comment: HIV Negative HIV-1/HIV-2 antibodies and HIV-1 p24 antigen were NOT detected. There is no laboratory evidence of HIV infection.   Hepatitis C Antibody     Status: None   Collection Time: 12/18/21 11:28 AM  Result Value Ref Range   Hep C Virus Ab Non Reactive Non Reactive    Comment: HCV antibody alone does not differentiate between previously resolved infection and active infection. Equivocal and Reactive HCV antibody results should be followed up with an HCV RNA test to support the diagnosis of active HCV infection.   Hepatitis B Surface AntiGEN     Status: None   Collection Time: 12/18/21 11:28 AM  Result Value Ref Range   Hepatitis B Surface Ag Negative Negative  RPR      Status: None   Collection Time: 12/18/21 11:28 AM  Result Value Ref Range   RPR Ser Ql Non Reactive Non Reactive     Assessment and Plan :   PDMP not reviewed this encounter.  1. Yeast vaginitis     She declined her AVS, did not want to repeat  a vaginal swab.  I deferred a urine pregnancy test that she is currently on her cycle.  Will use oral fluconazole to treat yeast vaginitis based off of her most recent test results from 12/18/2021.  Counseled patient on potential for adverse effects with medications prescribed/recommended today, ER and return-to-clinic precautions discussed, patient verbalized understanding.    Jaynee Eagles, PA-C 12/29/21 1309
